# Patient Record
Sex: Female | Born: 2000 | Race: White | Hispanic: No | Marital: Single | Smoking: Never smoker
Health system: Southern US, Community
[De-identification: ages and names within clinical notes are randomized; demographics above are authoritative.]

## PROBLEM LIST (undated history)

## (undated) DIAGNOSIS — G43909 Migraine, unspecified, not intractable, without status migrainosus: Secondary | ICD-10-CM

## (undated) DIAGNOSIS — R7689 Other specified abnormal immunological findings in serum: Secondary | ICD-10-CM

## (undated) DIAGNOSIS — R768 Other specified abnormal immunological findings in serum: Secondary | ICD-10-CM

## (undated) DIAGNOSIS — D649 Anemia, unspecified: Secondary | ICD-10-CM

## (undated) HISTORY — PX: SEPTOPLASTY: SHX2393

## (undated) HISTORY — DX: Other specified abnormal immunological findings in serum: R76.8

## (undated) HISTORY — DX: Migraine, unspecified, not intractable, without status migrainosus: G43.909

## (undated) HISTORY — DX: Anemia, unspecified: D64.9

## (undated) HISTORY — DX: Other specified abnormal immunological findings in serum: R76.89

---
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
## 2021-03-01 ENCOUNTER — Other Ambulatory Visit: Payer: Self-pay

## 2021-03-01 DIAGNOSIS — R509 Fever, unspecified: Secondary | ICD-10-CM | POA: Diagnosis present

## 2021-03-01 DIAGNOSIS — J101 Influenza due to other identified influenza virus with other respiratory manifestations: Secondary | ICD-10-CM | POA: Insufficient documentation

## 2021-03-01 LAB — CBC
HCT: 33.6 % — ABNORMAL LOW (ref 36.0–46.0)
Hemoglobin: 11.2 g/dL — ABNORMAL LOW (ref 12.0–15.0)
MCH: 28 pg (ref 26.0–34.0)
MCHC: 33.3 g/dL (ref 30.0–36.0)
MCV: 84 fL (ref 80.0–100.0)
Platelets: 418 10*3/uL — ABNORMAL HIGH (ref 150–400)
RBC: 4 MIL/uL (ref 3.87–5.11)
RDW: 12.3 % (ref 11.5–15.5)
WBC: 9.9 10*3/uL (ref 4.0–10.5)
nRBC: 0 % (ref 0.0–0.2)

## 2021-03-01 LAB — COMPREHENSIVE METABOLIC PANEL
ALT: 13 U/L (ref 0–44)
AST: 18 U/L (ref 15–41)
Albumin: 3.2 g/dL — ABNORMAL LOW (ref 3.5–5.0)
Alkaline Phosphatase: 77 U/L (ref 38–126)
Anion gap: 8 (ref 5–15)
BUN: 12 mg/dL (ref 6–20)
CO2: 25 mmol/L (ref 22–32)
Calcium: 8.4 mg/dL — ABNORMAL LOW (ref 8.9–10.3)
Chloride: 104 mmol/L (ref 98–111)
Creatinine, Ser: 0.75 mg/dL (ref 0.44–1.00)
GFR, Estimated: >60 mL/min (ref >60)
Glucose, Bld: 109 mg/dL — ABNORMAL HIGH (ref 70–99)
Potassium: 3.1 mmol/L — ABNORMAL LOW (ref 3.5–5.1)
Sodium: 137 mmol/L (ref 135–145)
Total Bilirubin: 0.2 mg/dL — ABNORMAL LOW (ref 0.3–1.2)
Total Protein: 7.7 g/dL (ref 6.5–8.1)

## 2021-03-01 MED ORDER — ACETAMINOPHEN 325 MG PO TABS
650.0000 mg | ORAL_TABLET | Freq: Once | ORAL | Status: AC
  Administered 2021-03-01: 650 mg via ORAL
  Filled 2021-03-01: qty 2

## 2021-03-01 MED ORDER — ONDANSETRON 4 MG PO TBDP: 4.0000 mg | ORAL_TABLET | Freq: Once | ORAL | Status: DC

## 2021-03-01 NOTE — ED Triage Notes (Addendum)
Pt was diagnosed with the flu Tuesday and has been vomiting today. Pt states she feels dehydrated, states painful to swallow. Results available on epic.

## 2021-03-02 ENCOUNTER — Emergency Department
Admission: EM | Admit: 2021-03-02 | Discharge: 2021-03-02 | Disposition: A | Discharge status: Home / Self Care | Payer: 59 | Attending: Emergency Medicine | Admitting: Emergency Medicine

## 2021-03-02 ENCOUNTER — Encounter: Payer: Self-pay | Admitting: Emergency Medicine

## 2021-03-02 DIAGNOSIS — R112 Nausea with vomiting, unspecified: Secondary | ICD-10-CM

## 2021-03-02 DIAGNOSIS — J101 Influenza due to other identified influenza virus with other respiratory manifestations: Secondary | ICD-10-CM

## 2021-03-02 LAB — URINALYSIS, COMPLETE (UACMP) WITH MICROSCOPIC
Bacteria, UA: NONE SEEN
Bilirubin Urine: NEGATIVE
Glucose, UA: NEGATIVE mg/dL
Ketones, ur: 5 mg/dL — AB
Leukocytes,Ua: NEGATIVE
Nitrite: NEGATIVE
Protein, ur: 30 mg/dL — AB
Specific Gravity, Urine: 1.031 — ABNORMAL HIGH (ref 1.005–1.030)
WBC, UA: NONE SEEN WBC/hpf (ref 0–5)
pH: 5 (ref 5.0–8.0)

## 2021-03-02 LAB — POC URINE PREG, ED: Preg Test, Ur: NEGATIVE

## 2021-03-02 MED ORDER — ONDANSETRON 4 MG PO TBDP: 4.0000 mg | ORAL_TABLET | Freq: Four times a day (QID) | ORAL | 0 refills | Status: DC | PRN

## 2021-03-02 MED ORDER — DEXAMETHASONE SODIUM PHOSPHATE 10 MG/ML IJ SOLN
10.0000 mg | Freq: Once | INTRAMUSCULAR | Status: AC
  Administered 2021-03-02: 10 mg via INTRAVENOUS
  Filled 2021-03-02: qty 1

## 2021-03-02 MED ORDER — KETOROLAC TROMETHAMINE 30 MG/ML IJ SOLN
30.0000 mg | Freq: Once | INTRAMUSCULAR | Status: AC
  Administered 2021-03-02: 30 mg via INTRAVENOUS
  Filled 2021-03-02: qty 1

## 2021-03-02 MED ORDER — SODIUM CHLORIDE 0.9 % IV BOLUS (SEPSIS)
1000.0000 mL | Freq: Once | INTRAVENOUS | Status: AC
  Administered 2021-03-02: 1000 mL via INTRAVENOUS

## 2021-03-02 MED ORDER — ONDANSETRON HCL 4 MG/2ML IJ SOLN
4.0000 mg | Freq: Once | INTRAMUSCULAR | Status: AC
  Administered 2021-03-02: 4 mg via INTRAVENOUS
  Filled 2021-03-02: qty 2

## 2021-03-02 NOTE — Discharge Instructions (Addendum)
You may alternate Tylenol 1000 mg every 6 hours as needed for pain, fever and Ibuprofen 800 mg every 8 hours as needed for pain, fever.  Please take Ibuprofen with food.  Do not take more than 4000 mg of Tylenol (acetaminophen) in a 24 hour period.  I suspect that your nausea and vomiting is secondary to Tamiflu.  You may stop this medication.  Steps to find a Primary Care Provider (PCP):  Call [phone] or [phone] to access "[facility] Find a Doctor Service."  2.  You may also go on the [facility] website at [email or web address]

## 2021-03-02 NOTE — ED Notes (Signed)
Pt. Preg. Was NEGATIVE.

## 2021-03-02 NOTE — ED Provider Notes (Signed)
[facility] Emergency Department Provider Note  ____________________________________________   Event Date/Time   First MD Initiated Contact with Patient 03/02/21 0254     (approximate)  I have reviewed the triage vital signs and the nursing notes.   HISTORY  Chief Complaint Vomiting    HPI Jillian Wagner is a 20 y.o. female with no significant past medical history who presents to the emergency department with complaints of fevers, body aches, dry cough, sore throat and now nausea and vomiting.  States symptoms started on Monday, October 31.  [facility] day and was diagnosed with flu a.  Had a negative strep test and mono test.  States she is on Tamiflu.  No abdominal pain, diarrhea, dysuria.  Currently on her menstrual cycle.  She has been vaccinated against influenza this year.        History reviewed. No pertinent past medical history.  There are no problems to display for this patient.   History reviewed. No pertinent surgical history.  Prior to Admission medications   Medication Sig Start Date End Date Taking? Authorizing Provider  ondansetron (ZOFRAN ODT) 4 MG disintegrating tablet Take 1 tablet (4 mg total) by mouth every 6 (six) hours as needed for nausea or vomiting. 03/02/21  Yes Kiylee Thoreson, Layla Maw, DO    Allergies Patient has no allergy information on record.  History reviewed. No pertinent family history.  Social History    Review of Systems Constitutional: + fever. Eyes: No visual changes. ENT: + sore throat. Cardiovascular: Denies chest pain. Respiratory: Denies shortness of breath. Gastrointestinal: + nausea, vomiting.  No diarrhea. Genitourinary: Negative for dysuria. Musculoskeletal: Negative for back pain. Skin: Negative for rash. Neurological: Negative for focal weakness or numbness.  ____________________________________________   PHYSICAL EXAM:  VITAL SIGNS: ED Triage Vitals [03/01/21 2330]   Enc Vitals Group     BP (!) 143/93     Pulse Rate (!) 136     Resp 20     Temp (!) 101.4 F (38.6 C)     Temp Source Oral     SpO2 96 %     Weight 135 lb (61.2 kg)     Height 5\' 7"  (1.702 m)     Head Circumference      Peak Flow      Pain Score 8     Pain Loc      Pain Edu?      Excl. in GC?    CONSTITUTIONAL: Alert and oriented and responds appropriately to questions. Well-appearing; well-nourished HEAD: Normocephalic EYES: Conjunctivae clear, pupils appear equal, EOM appear intact ENT: normal nose; moist mucous membranes; patient has bilateral tonsillar hypertrophy with exudate, no uvular deviation or uvular swelling, no trismus or drooling, normal phonation NECK: Supple, normal ROM CARD: RRR; S1 and S2 appreciated; no murmurs, no clicks, no rubs, no gallops RESP: Normal chest excursion without splinting or tachypnea; breath sounds clear and equal bilaterally; no wheezes, no rhonchi, no rales, no hypoxia or respiratory distress, speaking full sentences ABD/GI: Normal bowel sounds; non-distended; soft, non-tender, no rebound, no guarding, no peritoneal signs, no hepatosplenomegaly BACK: The back appears normal EXT: Normal ROM in all joints; no deformity noted, no edema; no cyanosis SKIN: Normal color for age and race; warm; no rash on exposed skin NEURO: Moves all extremities equally PSYCH: The patient's mood and manner are appropriate.  ____________________________________________   LABS (all labs ordered are listed, but only abnormal results are displayed)  Labs  Reviewed  CBC - Abnormal; Notable for the following components:      Result Value   Hemoglobin 11.2 (*)    HCT 33.6 (*)    Platelets 418 (*)    All other components within normal limits  COMPREHENSIVE METABOLIC PANEL - Abnormal; Notable for the following components:   Potassium 3.1 (*)    Glucose, Bld 109 (*)    Calcium 8.4 (*)    Albumin 3.2 (*)    Total Bilirubin 0.2 (*)    All other components within  normal limits  URINALYSIS, COMPLETE (UACMP) WITH MICROSCOPIC - Abnormal; Notable for the following components:   Color, Urine YELLOW (*)    APPearance HAZY (*)    Specific Gravity, Urine 1.031 (*)    Hgb urine dipstick SMALL (*)    Ketones, ur 5 (*)    Protein, ur 30 (*)    All other components within normal limits  POC URINE PREG, ED   ____________________________________________  EKG   ____________________________________________  RADIOLOGY I, Karima Carrell, personally viewed and evaluated these images (plain radiographs) as part of my medical decision making, as well as reviewing the written report by the radiologist.  ED MD interpretation:    Official radiology report(s): No results found.  ____________________________________________   PROCEDURES  Procedure(s) performed (including Critical Care):  Procedures    ____________________________________________   INITIAL IMPRESSION / ASSESSMENT AND PLAN / ED COURSE  As part of my medical decision making, I reviewed the following data within the electronic MEDICAL RECORD NUMBER Nursing notes reviewed and incorporated, Labs reviewed , Old chart reviewed, and Notes from prior ED visits         Patient here with nausea and vomiting after recently being diagnosed with influenza A.  She is on Tamiflu which I suspect is likely the cause of the symptoms.  We discussed risk and benefits of this medication and she is going to stop taking it.  Will give IV fluids, Zofran here.  She does have tonsillar hypertrophy with exudate but states she was strep and mono negative.  Will give Toradol, Decadron for symptomatic relief.  No signs of deep space neck infection, PTA, uvulitis, Ludwig's on exam.  Otherwise well-appearing, nontoxic.  Labs, urine obtained from triage show no significant abnormality.  Will p.o. challenge after IV medications.  ED PROGRESS  Patient reports feeling much better.  Able to tolerate p.o.  I feel she is safe to  be discharged.  Will provide prescription of Zofran.   At this time, I do not feel there is any life-threatening condition present. I have reviewed, interpreted and discussed all results (EKG, imaging, lab, urine as appropriate) and exam findings with patient/family. I have reviewed nursing notes and appropriate previous records.  I feel the patient is safe to be discharged home without further emergent workup and can continue workup as an outpatient as needed. Discussed usual and customary return precautions. Patient/family verbalize understanding and are comfortable with this plan.  Outpatient follow-up has been provided as needed. All questions have been answered.  ____________________________________________   FINAL CLINICAL IMPRESSION(S) / ED DIAGNOSES  Final diagnoses:  Nausea and vomiting in adult  Influenza A     ED Discharge Orders          Ordered    ondansetron (ZOFRAN ODT) 4 MG disintegrating tablet  Every 6 hours PRN        03/02/21 0455            *Please note:  Jillian Sago  Wagner was evaluated in Emergency Department on 03/02/2021 for the symptoms described in the history of present illness. She was evaluated in the context of the global COVID-19 pandemic, which necessitated consideration that the patient might be at risk for infection with the SARS-CoV-2 virus that causes COVID-19. Institutional protocols and algorithms that pertain to the evaluation of patients at risk for COVID-19 are in a state of rapid change based on information released by regulatory bodies including the CDC and federal and state organizations. These policies and algorithms were followed during the patient's care in the ED.  Some ED evaluations and interventions may be delayed as a result of limited staffing during and the pandemic.*   Note:  This document was prepared using Dragon voice recognition software and may include unintentional dictation errors.    Leeman Johnsey, Layla Maw, DO 03/02/21 [phone]

## 2021-03-03 ENCOUNTER — Emergency Department: Payer: 59

## 2021-03-03 ENCOUNTER — Encounter: Payer: Self-pay | Admitting: Emergency Medicine

## 2021-03-03 ENCOUNTER — Emergency Department
Admission: EM | Admit: 2021-03-03 | Discharge: 2021-03-03 | Disposition: A | Discharge status: Home / Self Care | Payer: 59 | Attending: Emergency Medicine | Admitting: Emergency Medicine

## 2021-03-03 ENCOUNTER — Other Ambulatory Visit: Payer: Self-pay

## 2021-03-03 DIAGNOSIS — R0789 Other chest pain: Secondary | ICD-10-CM | POA: Diagnosis present

## 2021-03-03 LAB — D-DIMER, QUANTITATIVE: D-Dimer, Quant: 0.91 ug/mL-FEU — ABNORMAL HIGH (ref 0.00–0.50)

## 2021-03-03 LAB — TROPONIN I (HIGH SENSITIVITY): Troponin I (High Sensitivity): 4 ng/L (ref <18)

## 2021-03-03 MED ORDER — KETOROLAC TROMETHAMINE 60 MG/2ML IM SOLN: 15.0000 mg | Freq: Once | INTRAMUSCULAR | Status: DC

## 2021-03-03 MED ORDER — HYDROCODONE-ACETAMINOPHEN 5-325 MG PO TABS
1.0000 | ORAL_TABLET | Freq: Once | ORAL | Status: AC
  Administered 2021-03-03: 1 via ORAL
  Filled 2021-03-03: qty 1

## 2021-03-03 MED ORDER — IOHEXOL 350 MG/ML SOLN
75.0000 mL | Freq: Once | INTRAVENOUS | Status: AC | PRN
  Administered 2021-03-03: 75 mL via INTRAVENOUS
  Filled 2021-03-03: qty 75

## 2021-03-03 MED ORDER — HYDROCODONE-ACETAMINOPHEN 5-325 MG PO TABS: 1.0000 | ORAL_TABLET | Freq: Four times a day (QID) | ORAL | 0 refills | Status: AC | PRN

## 2021-03-03 MED ORDER — NAPROXEN 500 MG PO TABS: 500.0000 mg | ORAL_TABLET | Freq: Two times a day (BID) | ORAL | 0 refills | Status: DC

## 2021-03-03 MED ORDER — KETOROLAC TROMETHAMINE 30 MG/ML IJ SOLN
15.0000 mg | Freq: Once | INTRAMUSCULAR | Status: AC
  Administered 2021-03-03: 15 mg via INTRAVENOUS
  Filled 2021-03-03: qty 1

## 2021-03-03 NOTE — ED Triage Notes (Signed)
Pt via POV from home. Pt c/o non-radiating centralized stabbing. CP that started last night that gradually gotten worse. Pt also endorses SOB, states it hurts to take a deep breath. Pt did test Positive for the flu on Tuesday. Denies NVD. Denies fever.

## 2021-03-03 NOTE — ED Provider Notes (Signed)
[facility] Emergency Department Provider Note  ____________________________________________   Event Date/Time   First MD Initiated Contact with Patient 03/03/21 2004     (approximate)  I have reviewed the triage vital signs and the nursing notes.   HISTORY  Chief Complaint Chest Pain   HPI Jillian Wagner is a 20 y.o. female  presents to the emergency department for treatment and evaluation of midsternal chest pain that started last night and has gradually worsened.  She is also feeling somewhat short of breath.  She states it hurts to take a deep breath as well.  She tested positive for flu on Tuesday.  Most of her symptoms of gotten better and she has not had a fever for the past 24 hours.  No relief with over-the-counter cold medications..   History reviewed. No pertinent past medical history.  There are no problems to display for this patient.   History reviewed. No pertinent surgical history.  Prior to Admission medications   Medication Sig Start Date End Date Taking? Authorizing Provider  HYDROcodone-acetaminophen (NORCO/VICODIN) 5-325 MG tablet Take 1 tablet by mouth every 6 (six) hours as needed for up to 3 days for severe pain. 03/03/21 03/06/21 Yes Koya Hunger B, FNP  naproxen (NAPROSYN) 500 MG tablet Take 1 tablet (500 mg total) by mouth 2 (two) times daily with a meal. 03/03/21  Yes Marvine Encalade B, FNP  ondansetron (ZOFRAN ODT) 4 MG disintegrating tablet Take 1 tablet (4 mg total) by mouth every 6 (six) hours as needed for nausea or vomiting. 03/02/21   Ward, Layla MawKristen N, DO    Allergies Patient has no allergy information on record.  History reviewed. No pertinent family history.  Social History Social History   Tobacco Use   Smoking status: Never   Smokeless tobacco: Never    Review of Systems  Constitutional: No fever/chills. Eyes: No visual changes. ENT: No sore throat. Cardiovascular: Positive for chest chest pain.   Positive for pleuritic pain.  Negative for palpitations.  Negative for leg pain. Respiratory: Positive for shortness of breath. Gastrointestinal: Negative for abdominal pain.  No nausea, no vomiting.  No diarrhea.  No constipation. Genitourinary: Negative for dysuria. Musculoskeletal: Negative for back pain.  Skin: Negative for rash, lesion, wound. Neurological: Negative for headaches, focal weakness or numbness.  ____________________________________________   PHYSICAL EXAM:  VITAL SIGNS: ED Triage Vitals  Enc Vitals Group     BP 03/03/21 1806 133/86     Pulse Rate 03/03/21 1806 96     Resp 03/03/21 1806 20     Temp 03/03/21 1806 98.1 F (36.7 C)     Temp Source 03/03/21 1806 Oral     SpO2 03/03/21 1806 100 %     Weight 03/03/21 1807 135 lb (61.2 kg)     Height 03/03/21 1807 5\' 7"  (1.702 m)     Head Circumference --      Peak Flow --      Pain Score 03/03/21 1807 9     Pain Loc --      Pain Edu? --      Excl. in GC? --     Constitutional: Alert and oriented.  Overall well appearing and in no acute distress.  Normal mental status. Eyes: Conjunctivae are normal. PERRL. Head: Atraumatic. Nose: No congestion/rhinnorhea. Mouth/Throat: Mucous membranes are moist.  Oropharynx non-erythematous. Tongue normal in size and color. Neck: No stridor.  No carotid bruit appreciated on exam. Hematological/Lymphatic/Immunilogical: No cervical lymphadenopathy. Cardiovascular: Normal rate, regular  rhythm. Grossly normal heart sounds.  Good peripheral circulation. Respiratory: Normal respiratory effort.  No retractions. Lungs CTAB. Gastrointestinal: Soft and nontender. No distention. No abdominal bruits. No CVA tenderness. Genitourinary: Exam deferred. Musculoskeletal: No lower extremity tenderness.  No edema of extremities. Neurologic:  Normal speech and language. No gross focal neurologic deficits are appreciated. Skin:  Skin is warm, dry and intact. No rash noted. Psychiatric: Mood and  affect are normal. Speech and behavior are normal.  ____________________________________________   LABS (all labs ordered are listed, but only abnormal results are displayed)  Labs Reviewed  D-DIMER, QUANTITATIVE - Abnormal; Notable for the following components:      Result Value   D-Dimer, Quant 0.91 (*)    All other components within normal limits  TROPONIN I (HIGH SENSITIVITY)   ____________________________________________  EKG  ED ECG REPORT I, Tyhesha Dutson, FNP-BC personally viewed and interpreted this ECG.   Date: 03/03/2021   Rate: 94  Rhythm: normal EKG, normal sinus rhythm  Axis: Normal  Intervals:none  ST&T Change: No  ____________________________________________  RADIOLOGY  ED MD interpretation: Chest x-ray shows no acute cardiopulmonary abnormality.  CTA chest for PE negative for acute concerns per radiology.  I, Kem Boroughs, personally viewed and evaluated these images (plain radiographs) as part of my medical decision making, as well as reviewing the written report by the radiologist.  Official radiology report(s): No results found.  ____________________________________________   PROCEDURES  Procedure(s) performed: None  Procedures  Critical Care performed: No  ____________________________________________   INITIAL IMPRESSION / ASSESSMENT AND PLAN   20 year old female presenting to the emergency department for treatment and evaluation of midsternal chest pain with pleurisy and shortness of breath after testing positive for influenza 4 days ago.  See HPI for further details.   Differential diagnosis includes, but not limited to:  Differential includes, but is not limited to, viral syndrome, bronchitis including COPD exacerbation, reactive airway disease including asthma, CHF including exacerbation with or without pulmonary/interstitial edema, pneumothorax, ACS, thoracic trauma, and pulmonary embolism, ACS, aortic dissection, pulmonary  embolism, cardiac tamponade, pneumothorax, pneumonia, pericarditis, myocarditis, GI-related causes including esophagitis/gastritis, and musculoskeletal chest wall pain.    ED COURSE  Troponin is normal.  Patient is low risk for PE however D-dimer was elevated 2.91.  CTA of the chest to rule out PE was negative per the radiologist.  Patient will be treated with NSAIDs and short course of Norco.  If symptoms or not improving over the next few days, she is to follow-up with her primary care provider.  If symptoms change or worsen and she is unable to schedule appointment she is to return to the emergency department.     FINAL CLINICAL IMPRESSION(S) / ED DIAGNOSES  Final diagnoses:  Chest wall pain    ED Discharge Orders          Ordered    HYDROcodone-acetaminophen (NORCO/VICODIN) 5-325 MG tablet  Every 6 hours PRN        03/03/21 2237    naproxen (NAPROSYN) 500 MG tablet  2 times daily with meals        03/03/21 2237            As part of my medical decision making, I reviewed the following data within the electronic MEDICAL RECORD NUMBER EKG interpreted NSR    Menaal Russum was evaluated in Emergency Department on 03/04/2021 for the symptoms described in the history of present illness. She was evaluated in the context of the global COVID-19 pandemic, which necessitated  consideration that the patient might be at risk for infection with the SARS-CoV-2 virus that causes COVID-19. Institutional protocols and algorithms that pertain to the evaluation of patients at risk for COVID-19 are in a state of rapid change based on information released by regulatory bodies including the CDC and federal and state organizations. These policies and algorithms were followed during the patient's care in the ED.   Note:  This document was prepared using Dragon voice recognition software and may include unintentional dictation errors.    Victorino Dike, FNP 03/04/21 2157    Delman Kitten,  MD 03/04/21 2324

## 2021-03-03 NOTE — ED Provider Notes (Signed)
Emergency Medicine Provider Triage Evaluation Note  Jillian Wagner , a 20 y.o. female  was evaluated in triage.  Pt complains of Pt reports that she tested positive for the flu 4 days ago - Today she is c/o stabbing cheat pain that is worse with deep breaths, SHOB, generalized body aches - She is afebrile .  Review of Systems  Positive: Chest pain with inspiration, body aches, SHOB Negative: Fever, N/V/D, abd pain  Physical Exam  BP 133/86 (BP Location: Right Arm)   Pulse 96   Resp 20   Ht 5\' 7"  (1.702 m)   Wt 61.2 kg   LMP 02/13/2021   SpO2 100%   BMI 21.14 kg/m  Gen:   Awake, no distress   Resp:  Normal effort  MSK:   Moves extremities without difficulty generalized body aches  Other:    Medical Decision Making  Medically screening exam initiated at 6:08 PM.  Appropriate orders placed.  Jillian Wagner was informed that the remainder of the evaluation will be completed by another provider, this initial triage assessment does not replace that evaluation, and the importance of remaining in the ED until their evaluation is complete.  Pt appears in no distress, respirations are even and unlabored - EKG obtained d/t report of CP - I feel this is more respiratory in nature - Will obtain CXR    [name], NP 03/03/21 13/05/22, MD 03/03/21 1904

## 2021-03-03 NOTE — ED Notes (Signed)
Patient declined discharge vital signs. 

## 2021-05-17 ENCOUNTER — Encounter: Payer: Self-pay | Admitting: *Deleted

## 2021-05-17 ENCOUNTER — Other Ambulatory Visit: Payer: Self-pay | Admitting: *Deleted

## 2021-05-25 ENCOUNTER — Inpatient Hospital Stay: Payer: 59

## 2021-05-25 ENCOUNTER — Inpatient Hospital Stay: Payer: 59 | Admitting: Internal Medicine

## 2021-05-25 ENCOUNTER — Inpatient Hospital Stay: Payer: 59 | Attending: Internal Medicine | Admitting: Oncology

## 2021-05-25 ENCOUNTER — Other Ambulatory Visit: Payer: Self-pay

## 2021-05-25 ENCOUNTER — Encounter: Payer: Self-pay | Admitting: Oncology

## 2021-05-25 VITALS — BP 128/79 | HR 102 | Temp 98.7°F | Resp 20 | Wt 141.2 lb

## 2021-05-25 DIAGNOSIS — D508 Other iron deficiency anemias: Secondary | ICD-10-CM | POA: Diagnosis not present

## 2021-05-25 DIAGNOSIS — D509 Iron deficiency anemia, unspecified: Secondary | ICD-10-CM | POA: Diagnosis present

## 2021-05-25 DIAGNOSIS — E222 Syndrome of inappropriate secretion of antidiuretic hormone: Secondary | ICD-10-CM

## 2021-05-27 ENCOUNTER — Encounter: Payer: Self-pay | Admitting: Oncology

## 2021-05-27 DIAGNOSIS — D509 Iron deficiency anemia, unspecified: Secondary | ICD-10-CM | POA: Insufficient documentation

## 2021-05-27 NOTE — Progress Notes (Signed)
Hematology/Oncology Consult note [facility] Telephone:([phone] [phone]  Patient Care Team: Pcp, No as PCP - General Cammie Sickle, MD as Consulting Physician (Hematology and Oncology)   Name of the patient: Jillian Wagner  KF:[id number]  [birth year]    Reason for referral-iron deficiency anemia   Referring physician- Dr. Shelia Media  Date of visit: 05/27/21   History of presenting illness-patient is a 21 year old female referred for iron deficiency anemia.  Prior to this she was getting her care through pediatrics.  She reports that her menstrual cycles are not particularly heavy and the last for about 5 days.  She has had EGD and colonoscopy in the past as well.  She has a history of cyclical vomiting syndrome.  Denies any consistent use of NSAIDs.  Denies any blood loss in her stool or urine.  Denies any dark melanotic stools.  She is currently on oral iron and has taken it for many months.  Her most recent hemoglobin was 11.5 in December 2022 with a ferritin level of 8 and iron saturation of 9% with an elevated TIBC of 530.  Patient currently reports feeling fatigued but denies other complaints.  She has received IV iron in the past  ECOG PS- 0  Pain scale- 0   Review of systems- Review of Systems  Constitutional:  Positive for malaise/fatigue.   Allergies  Allergen Reactions   Garlic Nausea And Vomiting   Potassium Citrate Nausea And Vomiting    There are no problems to display for this patient.    Past Medical History:  Diagnosis Date   Anemia    Positive ANA (antinuclear antibody)      History reviewed. No pertinent surgical history.  Social History   Socioeconomic History   Marital status: Single    Spouse name: Not on file   Number of children: Not on file   Years of education: Not on file   Highest education level: Not on file  Occupational History   Not on file  Tobacco Use   Smoking status: Never     Passive exposure: Never   Smokeless tobacco: Never  Vaping Use   Vaping Use: Never used  Substance and Sexual Activity   Alcohol use: Not Currently   Drug use: Never   Sexual activity: Not Currently  Other Topics Concern   Not on file  Social History Narrative   Not on file   Social Determinants of Health   Financial Resource Strain: Not on file  Food Insecurity: Not on file  Transportation Needs: Not on file  Physical Activity: Not on file  Stress: Not on file  Social Connections: Not on file  Intimate Partner Violence: Not on file     Family History  Problem Relation Age of Onset   Raynaud syndrome Mother    Other Mother        epitaxis   Asthma Father    Other Sister        GERD   Psoriasis Maternal Grandmother    Celiac disease Maternal Grandmother    Ulcerative colitis Paternal Grandmother    Arthritis-Osteo Paternal Grandfather      Current Outpatient Medications:    albuterol (VENTOLIN HFA) 108 (90 Base) MCG/ACT inhaler, Inhale 2 puffs into the lungs every 6 (six) hours as needed., Disp: , Rfl:    amitriptyline (ELAVIL) 10 MG tablet, Take 2 tablets by mouth at bedtime., Disp: , Rfl:    levocetirizine (XYZAL) 5 MG tablet, Take 5 mg  by mouth every evening., Disp: , Rfl:    TRI-ESTARYLLA 0.18/0.215/0.25 MG-35 MCG tablet, Take 1 tablet by mouth daily., Disp: , Rfl:    vitamin C (ASCORBIC ACID) 500 MG tablet, Take 500 mg by mouth daily., Disp: , Rfl:    EPINEPHrine 0.3 mg/0.3 mL IJ SOAJ injection, Inject into the muscle. (Patient not taking: Reported on 05/25/2021), Disp: , Rfl:    naproxen (NAPROSYN) 500 MG tablet, Take 1 tablet (500 mg total) by mouth 2 (two) times daily with a meal. (Patient not taking: Reported on 05/25/2021), Disp: 30 tablet, Rfl: 0   ondansetron (ZOFRAN ODT) 4 MG disintegrating tablet, Take 1 tablet (4 mg total) by mouth every 6 (six) hours as needed for nausea or vomiting. (Patient not taking: Reported on 05/25/2021), Disp: 20 tablet, Rfl:  0   Physical exam:  Vitals:   05/25/21 1336  BP: 128/79  Pulse: (!) 102  Resp: 20  Temp: 98.7 F (37.1 C)  SpO2: 100%  Weight: 141 lb 3.2 oz (64 kg)   Physical Exam Cardiovascular:     Rate and Rhythm: Normal rate and regular rhythm.     Heart sounds: Normal heart sounds.  Pulmonary:     Effort: Pulmonary effort is normal.     Breath sounds: Normal breath sounds.  Abdominal:     General: Bowel sounds are normal.     Palpations: Abdomen is soft.  Skin:    General: Skin is warm and dry.  Neurological:     Mental Status: She is alert and oriented to person, place, and time.       CMP Latest Ref Rng & Units 03/01/2021  Glucose 70 - 99 mg/dL 109(H)  BUN 6 - 20 mg/dL 12  Creatinine 0.44 - 1.00 mg/dL 0.75  Sodium 135 - 145 mmol/L 137  Potassium 3.5 - 5.1 mmol/L 3.1(L)  Chloride 98 - 111 mmol/L 104  CO2 22 - 32 mmol/L 25  Calcium 8.9 - 10.3 mg/dL 8.4(L)  Total Protein 6.5 - 8.1 g/dL 7.7  Total Bilirubin 0.3 - 1.2 mg/dL 0.2(L)  Alkaline Phos 38 - 126 U/L 77  AST 15 - 41 U/L 18  ALT 0 - 44 U/L 13   CBC Latest Ref Rng & Units 03/01/2021  WBC 4.0 - 10.5 K/uL 9.9  Hemoglobin 12.0 - 15.0 g/dL 11.2(L)  Hematocrit 36.0 - 46.0 % 33.6(L)  Platelets 150 - 400 K/uL 418(H)     Assessment and plan- Patient is a 21 y.o. female referred for iron deficiency anemia  Patient noted to have mild anemia with a hemoglobin of 11.4 with labs suggestive of iron deficiency.  She has had EGD and colonoscopy in the past.  Menstrual cycles are not particularly heavy.  Unclear if iron deficiency is related to malabsorption.  Given her ongoing fatigue she would like to receive IV iron and I will recommend 5 doses of Venofer 200 mg given over the next 2 to 3 weeks.  We will repeat CBC ferritin and iron studies B12 folate TSH and celiac disease panel in 6 weeks and see her for a video visit 2 to 3 days later.  We will also check H. pylori stool antigen at this time   Thank you for this kind referral  and the opportunity to participate in the care of this patient   Visit Diagnosis Iron deficiency anemia  Dr. Randa Evens, MD, MPH [facility] [phone] 05/27/2021

## 2021-05-30 ENCOUNTER — Other Ambulatory Visit: Payer: Self-pay

## 2021-05-30 ENCOUNTER — Inpatient Hospital Stay: Payer: 59 | Attending: Oncology

## 2021-05-30 VITALS — BP 118/88 | HR 70 | Temp 97.8°F | Resp 18

## 2021-05-30 DIAGNOSIS — D509 Iron deficiency anemia, unspecified: Secondary | ICD-10-CM | POA: Insufficient documentation

## 2021-05-30 DIAGNOSIS — D508 Other iron deficiency anemias: Secondary | ICD-10-CM

## 2021-05-30 MED ORDER — SODIUM CHLORIDE 0.9 % IV SOLN
Freq: Once | INTRAVENOUS | Status: AC
  Filled 2021-05-30: qty 250

## 2021-05-30 MED ORDER — SODIUM CHLORIDE 0.9 % IV SOLN: 200.0000 mg | INTRAVENOUS | Status: DC

## 2021-05-30 MED ORDER — IRON SUCROSE 20 MG/ML IV SOLN
200.0000 mg | Freq: Once | INTRAVENOUS | Status: AC
  Administered 2021-05-30: 200 mg via INTRAVENOUS
  Filled 2021-05-30: qty 10

## 2021-05-31 ENCOUNTER — Telehealth: Payer: Self-pay | Admitting: *Deleted

## 2021-05-31 NOTE — Telephone Encounter (Signed)
Call returned to patient and informed of doctor response 

## 2021-05-31 NOTE — Telephone Encounter (Signed)
Patient called reporting that she had her Iron infusion yesterday and last night and today she has body aches and a headache. Asking if thus is normal or not. Please advise

## 2021-05-31 NOTE — Telephone Encounter (Signed)
Recomemnd taking prn tylenol and monitor. Nothing else to do for now

## 2021-06-01 ENCOUNTER — Other Ambulatory Visit: Payer: Self-pay

## 2021-06-01 ENCOUNTER — Inpatient Hospital Stay: Payer: 59

## 2021-06-01 VITALS — BP 120/68 | HR 75 | Temp 96.4°F | Resp 16

## 2021-06-01 DIAGNOSIS — D508 Other iron deficiency anemias: Secondary | ICD-10-CM

## 2021-06-01 DIAGNOSIS — D509 Iron deficiency anemia, unspecified: Secondary | ICD-10-CM | POA: Diagnosis not present

## 2021-06-01 MED ORDER — SODIUM CHLORIDE 0.9 % IV SOLN: 200.0000 mg | INTRAVENOUS | Status: DC

## 2021-06-01 MED ORDER — SODIUM CHLORIDE 0.9 % IV SOLN
Freq: Once | INTRAVENOUS | Status: AC
  Filled 2021-06-01: qty 250

## 2021-06-01 MED ORDER — IRON SUCROSE 20 MG/ML IV SOLN
200.0000 mg | Freq: Once | INTRAVENOUS | Status: AC
  Administered 2021-06-01: 200 mg via INTRAVENOUS
  Filled 2021-06-01: qty 10

## 2021-06-06 ENCOUNTER — Other Ambulatory Visit: Payer: Self-pay

## 2021-06-06 ENCOUNTER — Inpatient Hospital Stay: Payer: 59

## 2021-06-06 VITALS — BP 123/88 | HR 81 | Temp 97.8°F | Resp 17

## 2021-06-06 DIAGNOSIS — D508 Other iron deficiency anemias: Secondary | ICD-10-CM

## 2021-06-06 DIAGNOSIS — D509 Iron deficiency anemia, unspecified: Secondary | ICD-10-CM | POA: Diagnosis not present

## 2021-06-06 MED ORDER — SODIUM CHLORIDE 0.9 % IV SOLN
Freq: Once | INTRAVENOUS | Status: AC
  Filled 2021-06-06: qty 250

## 2021-06-06 MED ORDER — IRON SUCROSE 20 MG/ML IV SOLN
200.0000 mg | Freq: Once | INTRAVENOUS | Status: AC
  Administered 2021-06-06: 200 mg via INTRAVENOUS
  Filled 2021-06-06: qty 10

## 2021-06-06 MED ORDER — SODIUM CHLORIDE 0.9 % IV SOLN: 200.0000 mg | INTRAVENOUS | Status: DC

## 2021-06-08 ENCOUNTER — Inpatient Hospital Stay: Payer: 59

## 2021-06-08 ENCOUNTER — Other Ambulatory Visit: Payer: Self-pay

## 2021-06-08 VITALS — BP 116/80 | HR 64 | Temp 97.5°F | Resp 18

## 2021-06-08 DIAGNOSIS — D509 Iron deficiency anemia, unspecified: Secondary | ICD-10-CM | POA: Diagnosis not present

## 2021-06-08 DIAGNOSIS — D508 Other iron deficiency anemias: Secondary | ICD-10-CM

## 2021-06-08 MED ORDER — IRON SUCROSE 20 MG/ML IV SOLN
200.0000 mg | Freq: Once | INTRAVENOUS | Status: AC
  Administered 2021-06-08: 200 mg via INTRAVENOUS
  Filled 2021-06-08: qty 10

## 2021-06-08 MED ORDER — SODIUM CHLORIDE 0.9 % IV SOLN: 200.0000 mg | INTRAVENOUS | Status: DC

## 2021-06-08 MED ORDER — SODIUM CHLORIDE 0.9 % IV SOLN
Freq: Once | INTRAVENOUS | Status: AC
  Filled 2021-06-08: qty 250

## 2021-06-13 ENCOUNTER — Inpatient Hospital Stay: Payer: 59

## 2021-06-13 ENCOUNTER — Encounter: Payer: Self-pay | Admitting: Oncology

## 2021-06-13 ENCOUNTER — Other Ambulatory Visit: Payer: Self-pay

## 2021-06-13 ENCOUNTER — Telehealth: Payer: Self-pay | Admitting: *Deleted

## 2021-06-13 VITALS — BP 133/82 | HR 89 | Temp 97.5°F

## 2021-06-13 DIAGNOSIS — D509 Iron deficiency anemia, unspecified: Secondary | ICD-10-CM | POA: Diagnosis not present

## 2021-06-13 DIAGNOSIS — D508 Other iron deficiency anemias: Secondary | ICD-10-CM

## 2021-06-13 MED ORDER — IRON SUCROSE 20 MG/ML IV SOLN
200.0000 mg | Freq: Once | INTRAVENOUS | Status: AC
  Administered 2021-06-13: 200 mg via INTRAVENOUS
  Filled 2021-06-13: qty 10

## 2021-06-13 MED ORDER — SODIUM CHLORIDE 0.9 % IV SOLN: 200.0000 mg | INTRAVENOUS | Status: DC

## 2021-06-13 MED ORDER — SODIUM CHLORIDE 0.9 % IV SOLN
Freq: Once | INTRAVENOUS | Status: AC
  Filled 2021-06-13: qty 250

## 2021-06-13 NOTE — Telephone Encounter (Signed)
Patient has an infusion scheduled for 3 PM this afternoon. This morning she was diagnosed with a sinus infection (tested negative for Covid). She wants to know if it is ok for her to come to her infusion appointment. Please call her with response.

## 2021-06-13 NOTE — Telephone Encounter (Signed)
MyChart message sent to patient:  Jillian Wagner, since you tested negative for COVID it is OK to keep your iron appointment for this afternoon.  Let us know if you would prefer to cancel or reschedule.

## 2021-06-13 NOTE — Patient Instructions (Addendum)
[facility]  Discharge Instructions: ?Thank you for choosing [facility] to provide your oncology and hematology care.  ?If you have a lab appointment with the Cancer Center, please go directly to the Cancer Center and check in at the registration area. ? ?Wear comfortable clothing and clothing appropriate for easy access to any Portacath or PICC line.  ? ?We strive to give you quality time with your provider. You may need to reschedule your appointment if you arrive late (15 or more minutes).  Arriving late affects you and other patients whose appointments are after yours.  Also, if you miss three or more appointments without notifying the office, you may be dismissed from the clinic at the provider?s discretion.    ?  ?For prescription refill requests, have your pharmacy contact our office and allow 72 hours for refills to be completed.   ? ?Today you received the following chemotherapy and/or immunotherapy agents VENOFER    ?  ?To help prevent nausea and vomiting after your treatment, we encourage you to take your nausea medication as directed. ? ?BELOW ARE SYMPTOMS THAT SHOULD BE REPORTED IMMEDIATELY: ?*FEVER GREATER THAN 100.4 F (38 ?C) OR HIGHER ?*CHILLS OR SWEATING ?*NAUSEA AND VOMITING THAT IS NOT CONTROLLED WITH YOUR NAUSEA MEDICATION ?*UNUSUAL SHORTNESS OF BREATH ?*UNUSUAL BRUISING OR BLEEDING ?*URINARY PROBLEMS (pain or burning when urinating, or frequent urination) ?*BOWEL PROBLEMS (unusual diarrhea, constipation, pain near the anus) ?TENDERNESS IN MOUTH AND THROAT WITH OR WITHOUT PRESENCE OF ULCERS (sore throat, sores in mouth, or a toothache) ?UNUSUAL RASH, SWELLING OR PAIN  ?UNUSUAL VAGINAL DISCHARGE OR ITCHING  ? ?Items with * indicate a potential emergency and should be followed up as soon as possible or go to the Emergency Department if any problems should occur. ? ?Please show the CHEMOTHERAPY ALERT CARD or IMMUNOTHERAPY ALERT CARD at check-in to the  Emergency Department and triage nurse. ? ?Should you have questions after your visit or need to cancel or reschedule your appointment, please contact [facility]  [phone] and follow the prompts.  Office hours are 8:00 a.m. to 4:30 p.m. Monday - Friday. Please note that voicemails left after 4:00 p.m. may not be returned until the following business day.  We are closed weekends and major holidays. You have access to a nurse at all times for urgent questions. Please call the main number to the clinic [phone] and follow the prompts. ? ?For any non-urgent questions, you may also contact your provider using MyChart. We now offer e-Visits for anyone 18 and older to request care online for non-urgent symptoms. For details visit [email or web address]. ?  ?Also download the MyChart app! Go to the app store, search "MyChart", open the app, select [facility], and log in with your MyChart username and password. ? ?Due to Covid, a mask is required upon entering the hospital/clinic. If you do not have a mask, one will be given to you upon arrival. For doctor visits, patients may have 1 support person aged 18 or older with them. For treatment visits, patients cannot have anyone with them due to current Covid guidelines and our immunocompromised population.  ? ?Iron Sucrose Injection ?What is this medication? ?IRON SUCROSE (EYE ern SOO krose) treats low levels of iron (iron deficiency anemia) in people with kidney disease. Iron is a mineral that plays an important role in making red blood cells, which carry oxygen from your lungs to the rest of your body. ?This medicine may   be used for other purposes; ask your health care provider or pharmacist if you have questions. ?COMMON BRAND NAME(S): Venofer ?What should I tell my care team before I take this medication? ?They need to know if you have any of these conditions: ?Anemia not caused by low iron levels ?Heart disease ?High levels of  iron in the blood ?Kidney disease ?Liver disease ?An unusual or allergic reaction to iron, other medications, foods, dyes, or preservatives ?Pregnant or trying to get pregnant ?Breast-feeding ?How should I use this medication? ?This medication is for infusion into a vein. It is given in a hospital or clinic setting. ?Talk to your care team about the use of this medication in children. While this medication may be prescribed for children as young as 2 years for selected conditions, precautions do apply. ?Overdosage: If you think you have taken too much of this medicine contact a poison control center or emergency room at once. ?NOTE: This medicine is only for you. Do not share this medicine with others. ?What if I miss a dose? ?It is important not to miss your dose. Call your care team if you are unable to keep an appointment. ?What may interact with this medication? ?Do not take this medication with any of the following: ?Deferoxamine ?Dimercaprol ?Other iron products ?This medication may also interact with the following: ?Chloramphenicol ?Deferasirox ?This list may not describe all possible interactions. Give your health care provider a list of all the medicines, herbs, non-prescription drugs, or dietary supplements you use. Also tell them if you smoke, drink alcohol, or use illegal drugs. Some items may interact with your medicine. ?What should I watch for while using this medication? ?Visit your care team regularly. Tell your care team if your symptoms do not start to get better or if they get worse. You may need blood work done while you are taking this medication. ?You may need to follow a special diet. Talk to your care team. Foods that contain iron include: whole grains/cereals, dried fruits, beans, or peas, leafy green vegetables, and organ meats (liver, kidney). ?What side effects may I notice from receiving this medication? ?Side effects that you should report to your care team as soon as  possible: ?Allergic reactions--skin rash, itching, hives, swelling of the face, lips, tongue, or throat ?Low blood pressure--dizziness, feeling faint or lightheaded, blurry vision ?Shortness of breath ?Side effects that usually do not require medical attention (report to your care team if they continue or are bothersome): ?Flushing ?Headache ?Joint pain ?Muscle pain ?Nausea ?Pain, redness, or irritation at injection site ?This list may not describe all possible side effects. Call your doctor for medical advice about side effects. You may report side effects to FDA at 1-800-FDA-1088. ?Where should I keep my medication? ?This medication is given in a hospital or clinic and will not be stored at home. ?NOTE: This sheet is a summary. It may not cover all possible information. If you have questions about this medicine, talk to your doctor, pharmacist, or health care provider. ?? 2022 Elsevier/Gold Standard (2020-09-08 00:00:00) ? ?

## 2021-07-04 ENCOUNTER — Inpatient Hospital Stay: Payer: 59 | Attending: Oncology

## 2021-07-04 ENCOUNTER — Other Ambulatory Visit: Payer: Self-pay

## 2021-07-04 ENCOUNTER — Encounter: Payer: Self-pay | Admitting: Oncology

## 2021-07-04 ENCOUNTER — Other Ambulatory Visit: Payer: Self-pay | Admitting: *Deleted

## 2021-07-04 DIAGNOSIS — E222 Syndrome of inappropriate secretion of antidiuretic hormone: Secondary | ICD-10-CM

## 2021-07-04 DIAGNOSIS — D508 Other iron deficiency anemias: Secondary | ICD-10-CM

## 2021-07-04 DIAGNOSIS — D509 Iron deficiency anemia, unspecified: Secondary | ICD-10-CM | POA: Insufficient documentation

## 2021-07-04 DIAGNOSIS — E538 Deficiency of other specified B group vitamins: Secondary | ICD-10-CM | POA: Insufficient documentation

## 2021-07-04 LAB — CBC WITH DIFFERENTIAL/PLATELET
Abs Immature Granulocytes: 0.01 10*3/uL (ref 0.00–0.07)
Basophils Absolute: 0 10*3/uL (ref 0.0–0.1)
Basophils Relative: 0 %
Eosinophils Absolute: 0.3 10*3/uL (ref 0.0–0.5)
Eosinophils Relative: 3 %
HCT: 38.8 % (ref 36.0–46.0)
Hemoglobin: 13.2 g/dL (ref 12.0–15.0)
Immature Granulocytes: 0 %
Lymphocytes Relative: 29 %
Lymphs Abs: 2.4 10*3/uL (ref 0.7–4.0)
MCH: 29.2 pg (ref 26.0–34.0)
MCHC: 34 g/dL (ref 30.0–36.0)
MCV: 85.8 fL (ref 80.0–100.0)
Monocytes Absolute: 0.6 10*3/uL (ref 0.1–1.0)
Monocytes Relative: 7 %
Neutro Abs: 5 10*3/uL (ref 1.7–7.7)
Neutrophils Relative %: 61 %
Platelets: 362 10*3/uL (ref 150–400)
RBC: 4.52 MIL/uL (ref 3.87–5.11)
RDW: 14.7 % (ref 11.5–15.5)
WBC: 8.3 10*3/uL (ref 4.0–10.5)
nRBC: 0 % (ref 0.0–0.2)

## 2021-07-04 LAB — IRON AND TIBC
Iron: 70 ug/dL (ref 28–170)
Saturation Ratios: 15 % (ref 10.4–31.8)
TIBC: 454 ug/dL — ABNORMAL HIGH (ref 250–450)
UIBC: 384 ug/dL

## 2021-07-04 LAB — VITAMIN B12: Vitamin B-12: 210 pg/mL (ref 180–914)

## 2021-07-04 LAB — FOLATE: Folate: 25 ng/mL (ref >5.9)

## 2021-07-04 LAB — FERRITIN: Ferritin: 210 ng/mL (ref 11–307)

## 2021-07-04 LAB — TSH: TSH: 0.477 u[IU]/mL (ref 0.350–4.500)

## 2021-07-04 NOTE — Telephone Encounter (Signed)
Called pt and informed that stool can be brought in room temp, does not need to be refrigerated or frozen. Pt understands.  ?

## 2021-07-06 ENCOUNTER — Encounter: Payer: Self-pay | Admitting: Oncology

## 2021-07-06 ENCOUNTER — Inpatient Hospital Stay (HOSPITAL_BASED_OUTPATIENT_CLINIC_OR_DEPARTMENT_OTHER): Payer: 59 | Admitting: Oncology

## 2021-07-06 DIAGNOSIS — E538 Deficiency of other specified B group vitamins: Secondary | ICD-10-CM | POA: Diagnosis not present

## 2021-07-06 DIAGNOSIS — D508 Other iron deficiency anemias: Secondary | ICD-10-CM | POA: Diagnosis not present

## 2021-07-06 LAB — H. PYLORI ANTIGEN, STOOL: H. Pylori Stool Ag, Eia: NEGATIVE

## 2021-07-06 LAB — CELIAC DISEASE PANEL
Endomysial Ab, IgA: NEGATIVE
IgA: 382 mg/dL — ABNORMAL HIGH (ref 87–352)
Tissue Transglutaminase Ab, IgA: <2 U/mL (ref 0–3)

## 2021-07-06 NOTE — Progress Notes (Signed)
I connected with Jillian Wagner on 07/06/21 at  1:00 PM EST by video enabled telemedicine visit and verified that I am speaking with the correct person using two identifiers. ?  ?I discussed the limitations, risks, security and privacy concerns of performing an evaluation and management service by telemedicine and the availability of in-person appointments. I also discussed with the patient that there may be a patient responsible charge related to this service. The patient expressed understanding and agreed to proceed. ? ?Other persons participating in the visit and their role in the encounter:  nonehi ? ?Patient's location:  home ?Provider's location:  work ? ?Chief Complaint:   Routine follow-up of iron deficiency anemia ? ?History of present illness: patient is a 22 year old female referred for iron deficiency anemia.  Prior to this she was getting her care through pediatrics.  She reports that her menstrual cycles are not particularly heavy and the last for about 5 days.  She has had EGD and colonoscopy in the past as well.  She has a history of cyclical vomiting syndrome.  Denies any consistent use of NSAIDs.  Denies any blood loss in her stool or urine.  Denies any dark melanotic stools.  She is currently on oral iron and has taken it for many months.  Her most recent hemoglobin was 11.5 in December 2022 with a ferritin level of 8 and iron saturation of 9% with an elevated TIBC of 530.   ? ? ?Interval history reports improvement in her energy levels after receiving IV iron. ? ? ?Review of Systems  ?Constitutional:  Negative for chills, fever, malaise/fatigue and weight loss.  ?HENT:  Negative for congestion, ear discharge and nosebleeds.   ?Eyes:  Negative for blurred vision.  ?Respiratory:  Negative for cough, hemoptysis, sputum production, shortness of breath and wheezing.   ?Cardiovascular:  Negative for chest pain, palpitations, orthopnea and claudication.  ?Gastrointestinal:  Negative for abdominal  pain, blood in stool, constipation, diarrhea, heartburn, melena, nausea and vomiting.  ?Genitourinary:  Negative for dysuria, flank pain, frequency, hematuria and urgency.  ?Musculoskeletal:  Negative for back pain, joint pain and myalgias.  ?Skin:  Negative for rash.  ?Neurological:  Negative for dizziness, tingling, focal weakness, seizures, weakness and headaches.  ?Endo/Heme/Allergies:  Does not bruise/bleed easily.  ?Psychiatric/Behavioral:  Negative for depression and suicidal ideas. The patient does not have insomnia.   ? ?Allergies  ?Allergen Reactions  ? Garlic Nausea And Vomiting  ? Potassium Citrate Nausea And Vomiting  ? ? ?Past Medical History:  ?Diagnosis Date  ? Anemia   ? Positive ANA (antinuclear antibody)   ? ? ?History reviewed. No pertinent surgical history. ? ?Social History  ? ?Socioeconomic History  ? Marital status: Single  ?  Spouse name: Not on file  ? Number of children: Not on file  ? Years of education: Not on file  ? Highest education level: Not on file  ?Occupational History  ? Not on file  ?Tobacco Use  ? Smoking status: Never  ?  Passive exposure: Never  ? Smokeless tobacco: Never  ?Vaping Use  ? Vaping Use: Never used  ?Substance and Sexual Activity  ? Alcohol use: Not Currently  ? Drug use: Never  ? Sexual activity: Not Currently  ?Other Topics Concern  ? Not on file  ?Social History Narrative  ? Not on file  ? ?Social Determinants of Health  ? ?Financial Resource Strain: Not on file  ?Food Insecurity: Not on file  ?Transportation Needs: Not on file  ?  Physical Activity: Not on file  ?Stress: Not on file  ?Social Connections: Not on file  ?Intimate Partner Violence: Not on file  ? ? ?Family History  ?Problem Relation Age of Onset  ? Raynaud syndrome Mother   ? Other Mother   ?     epitaxis  ? Asthma Father   ? Other Sister   ?     GERD  ? Psoriasis Maternal Grandmother   ? Celiac disease Maternal Grandmother   ? Ulcerative colitis Paternal Grandmother   ? Arthritis-Osteo Paternal  Grandfather   ? ? ? ?Current Outpatient Medications:  ?  amitriptyline (ELAVIL) 10 MG tablet, Take 2 tablets by mouth at bedtime., Disp: , Rfl:  ?  levocetirizine (XYZAL) 5 MG tablet, Take 5 mg by mouth every evening., Disp: , Rfl:  ?  TRI-ESTARYLLA 0.18/0.215/0.25 MG-35 MCG tablet, Take 1 tablet by mouth daily., Disp: , Rfl:  ?  vitamin C (ASCORBIC ACID) 500 MG tablet, Take 500 mg by mouth daily., Disp: , Rfl:  ?  albuterol (VENTOLIN HFA) 108 (90 Base) MCG/ACT inhaler, Inhale 2 puffs into the lungs every 6 (six) hours as needed. (Patient not taking: Reported on 07/06/2021), Disp: , Rfl:  ?  EPINEPHrine 0.3 mg/0.3 mL IJ SOAJ injection, Inject into the muscle. (Patient not taking: Reported on 05/25/2021), Disp: , Rfl:  ? ?No results found. ? ?No images are attached to the encounter. ? ? ?CMP Latest Ref Rng & Units 03/01/2021  ?Glucose 70 - 99 mg/dL 109(H)  ?BUN 6 - 20 mg/dL 12  ?Creatinine 0.44 - 1.00 mg/dL 0.75  ?Sodium 135 - 145 mmol/L 137  ?Potassium 3.5 - 5.1 mmol/L 3.1(L)  ?Chloride 98 - 111 mmol/L 104  ?CO2 22 - 32 mmol/L 25  ?Calcium 8.9 - 10.3 mg/dL 8.4(L)  ?Total Protein 6.5 - 8.1 g/dL 7.7  ?Total Bilirubin 0.3 - 1.2 mg/dL 0.2(L)  ?Alkaline Phos 38 - 126 U/L 77  ?AST 15 - 41 U/L 18  ?ALT 0 - 44 U/L 13  ? ?CBC Latest Ref Rng & Units 07/04/2021  ?WBC 4.0 - 10.5 K/uL 8.3  ?Hemoglobin 12.0 - 15.0 g/dL 13.2  ?Hematocrit 36.0 - 46.0 % 38.8  ?Platelets 150 - 400 K/uL 362  ? ? ? ?Observation/objective: Appears in no acute distress over video visit today.  Breathing is nonlabored ? ?Assessment and plan: Patient is a 21 year old female with history of iron deficiency anemia s/p IV iron this is a routine follow-up visit ? ?After receiving 5 doses of Venofer patient's hemoglobin has normalized to 13.2 from a pre-existing value of 11.2.Ferritin levels are normal at 210.  Folate, TSH normal.  Celiac disease panel negative.  H. pylori stool antigen negative.  B12 levels are low at 210 and have asked her to take oral B12 1000  mcg p.o. daily.  We will check CBC ferritin and iron studies and B12 levels in 6 months and I will see her thereafter ? ?Follow-up instructions: As above ? ?I discussed the assessment and treatment plan with the patient. The patient was provided an opportunity to ask questions and all were answered. The patient agreed with the plan and demonstrated an understanding of the instructions. ?  ?The patient was advised to call back or seek an in-person evaluation if the symptoms worsen or if the condition fails to improve as anticipated. ? ? ?Visit Diagnosis: ?1. Other iron deficiency anemia   ?2. B12 deficiency   ? ? ?Dr. Randa Evens, MD, MPH ?[facility]  [facility] ?Tel- [phone] ?07/06/2021 ?12:48 PM ? ?

## 2021-07-08 ENCOUNTER — Encounter: Payer: Self-pay | Admitting: Oncology

## 2021-12-10 ENCOUNTER — Other Ambulatory Visit: Payer: 59

## 2021-12-17 ENCOUNTER — Inpatient Hospital Stay: Payer: 59 | Attending: Oncology

## 2021-12-17 DIAGNOSIS — E538 Deficiency of other specified B group vitamins: Secondary | ICD-10-CM | POA: Insufficient documentation

## 2021-12-17 DIAGNOSIS — D508 Other iron deficiency anemias: Secondary | ICD-10-CM | POA: Diagnosis present

## 2021-12-17 LAB — CBC WITH DIFFERENTIAL/PLATELET
Abs Immature Granulocytes: 0.07 10*3/uL (ref 0.00–0.07)
Basophils Absolute: 0 10*3/uL (ref 0.0–0.1)
Basophils Relative: 0 %
Eosinophils Absolute: 0.2 10*3/uL (ref 0.0–0.5)
Eosinophils Relative: 2 %
HCT: 37.5 % (ref 36.0–46.0)
Hemoglobin: 12.8 g/dL (ref 12.0–15.0)
Immature Granulocytes: 1 %
Lymphocytes Relative: 14 %
Lymphs Abs: 2.1 10*3/uL (ref 0.7–4.0)
MCH: 30.7 pg (ref 26.0–34.0)
MCHC: 34.1 g/dL (ref 30.0–36.0)
MCV: 89.9 fL (ref 80.0–100.0)
Monocytes Absolute: 0.9 10*3/uL (ref 0.1–1.0)
Monocytes Relative: 6 %
Neutro Abs: 11.6 10*3/uL — ABNORMAL HIGH (ref 1.7–7.7)
Neutrophils Relative %: 77 %
Platelets: 352 10*3/uL (ref 150–400)
RBC: 4.17 MIL/uL (ref 3.87–5.11)
RDW: 12 % (ref 11.5–15.5)
WBC: 14.9 10*3/uL — ABNORMAL HIGH (ref 4.0–10.5)
nRBC: 0 % (ref 0.0–0.2)

## 2021-12-17 LAB — FERRITIN: Ferritin: 84 ng/mL (ref 11–307)

## 2021-12-17 LAB — VITAMIN B12: Vitamin B-12: 212 pg/mL (ref 180–914)

## 2021-12-17 LAB — IRON AND TIBC
Iron: 132 ug/dL (ref 28–170)
Saturation Ratios: 29 % (ref 10.4–31.8)
TIBC: 456 ug/dL — ABNORMAL HIGH (ref 250–450)
UIBC: 324 ug/dL

## 2021-12-18 ENCOUNTER — Telehealth: Payer: 59 | Admitting: Oncology

## 2021-12-19 ENCOUNTER — Inpatient Hospital Stay (HOSPITAL_BASED_OUTPATIENT_CLINIC_OR_DEPARTMENT_OTHER): Payer: 59 | Admitting: Medical Oncology

## 2021-12-19 ENCOUNTER — Encounter: Payer: Self-pay | Admitting: Medical Oncology

## 2021-12-19 DIAGNOSIS — E538 Deficiency of other specified B group vitamins: Secondary | ICD-10-CM | POA: Diagnosis not present

## 2021-12-19 DIAGNOSIS — D508 Other iron deficiency anemias: Secondary | ICD-10-CM

## 2021-12-19 NOTE — Progress Notes (Signed)
Virtual Visit Progress Note  Ms. Tietje,you are scheduled for a virtual visit with your provider today.    Just as we do with appointments in the office, we must obtain your consent to participate.  Your consent will be active for this visit and any virtual visit you may have with one of our providers in the next 365 days.    If you have a MyChart account, I can also send a copy of this consent to you electronically.  All virtual visits are billed to your insurance company just like a traditional visit in the office.  As this is a virtual visit, video technology does not allow for your provider to perform a traditional examination.  This may limit your provider's ability to fully assess your condition.  If your provider identifies any concerns that need to be evaluated in person or the need to arrange testing such as labs, EKG, etc, we will make arrangements to do so.    Although advances in technology are sophisticated, we cannot ensure that it will always work on either your end or our end.  If the connection with a video visit is poor, we may have to switch to a telephone visit.  With either a video or telephone visit, we are not always able to ensure that we have a secure connection.   I need to obtain your verbal consent now.   Are you willing to proceed with your visit today?   Abril Needler has provided verbal consent on 12/19/2021 for a virtual visit (video or telephone).   SHANOAH BUENDIA, PA-C 12/19/2021  2:04 PM    I connected with Jillian Wagner on 12/19/21 at  2:00 PM EDT by video enabled telemedicine visit and verified that I am speaking with the correct person using two identifiers.   I discussed the limitations, risks, security and privacy concerns of performing an evaluation and management service by telemedicine and the availability of in-person appointments. I also discussed with the patient that there may be a patient responsible charge related to this service. The  patient expressed understanding and agreed to proceed.   Other persons participating in the visit and their role in the encounter: None  Patient's location: [facility]  Provider's location: Clinic   Chief Complaint: IDA  History of present illness: She reports that her menstrual cycles are not particularly heavy and the last for about 5 days. She has had EGD and colonoscopy in the past as well.  She has a history of cyclical vomiting syndrome.  Denies any consistent use of NSAIDs.  Denies any blood loss in her stool or urine. Denies any dark melanotic stools.  She is currently on oral iron and has taken it for many months.  Her most recent hemoglobin was 11.5 in December 2022 with a ferritin level of 8 and iron saturation of 9% with an elevated TIBC of 530.  Patient currently reports feeling fatigued but denies other complaints.  She has received IV iron in the past.   Interval history: Today she reports that she is doing well.  At her last follow-up B12 supplementation was recommended.  Patient states that she has been taking this sporadically due to her crazy schedule during the summer but has plans to return to taking this daily.  She tolerated this well.  Has had IV iron well in the past.  She again reports that oral iron supplementation was not beneficial to her in the past as it did not raise  her iron or ferritin levels.  Denies any excessive fatigue, shortness of breath or bleeding.     Patient Care Team: Pcp, No as PCP - General Earna Coder, MD as Consulting Physician (Hematology and Oncology)   Name of the patient: Jillian Wagner  [id number]  [birth year]   Date of visit: 12/19/21  History of Presenting Illness-   Review of systems- ROS   Allergies  Allergen Reactions   Garlic Nausea And Vomiting   Potassium Citrate Nausea And Vomiting    Past Medical History:  Diagnosis Date   Anemia    Positive ANA (antinuclear antibody)     History reviewed. No  pertinent surgical history.  Social History   Socioeconomic History   Marital status: Single    Spouse name: Not on file   Number of children: Not on file   Years of education: Not on file   Highest education level: Not on file  Occupational History   Not on file  Tobacco Use   Smoking status: Never    Passive exposure: Never   Smokeless tobacco: Never  Vaping Use   Vaping Use: Never used  Substance and Sexual Activity   Alcohol use: Not Currently   Drug use: Never   Sexual activity: Not Currently  Other Topics Concern   Not on file  Social History Narrative   Not on file   Social Determinants of Health   Financial Resource Strain: Not on file  Food Insecurity: Not on file  Transportation Needs: Not on file  Physical Activity: Not on file  Stress: Not on file  Social Connections: Not on file  Intimate Partner Violence: Not on file    Immunization History  Administered Date(s) Administered   Influenza,inj,Quad PF,6+ Mos 02/06/2016, 01/28/2021   PFIZER(Purple Top)SARS-COV-2 Vaccination 06/25/2019, 07/16/2019, 03/31/2020   Pfizer Covid-19 Vaccine Bivalent Booster 76yrs & up 01/28/2021    Family History  Problem Relation Age of Onset   Raynaud syndrome Mother    Other Mother        epitaxis   Asthma Father    Other Sister        GERD   Psoriasis Maternal Grandmother    Celiac disease Maternal Grandmother    Ulcerative colitis Paternal Grandmother    Arthritis-Osteo Paternal Grandfather      Current Outpatient Medications:    amitriptyline (ELAVIL) 10 MG tablet, Take 2 tablets by mouth at bedtime., Disp: , Rfl:    buPROPion (WELLBUTRIN XL) 300 MG 24 hr tablet, , Disp: , Rfl:    levocetirizine (XYZAL) 5 MG tablet, Take 5 mg by mouth every evening., Disp: , Rfl:    Norgestimate-Ethinyl Estradiol Triphasic (TRI-LO-MARZIA) 0.18/0.215/0.25 MG-25 MCG tab, 1 tablet, Disp: , Rfl:    TRI-ESTARYLLA 0.18/0.215/0.25 MG-35 MCG tablet, Take 1 tablet by mouth daily.,  Disp: , Rfl:    vitamin C (ASCORBIC ACID) 500 MG tablet, Take 500 mg by mouth daily., Disp: , Rfl:    albuterol (VENTOLIN HFA) 108 (90 Base) MCG/ACT inhaler, Inhale 2 puffs into the lungs every 6 (six) hours as needed. (Patient not taking: Reported on 07/06/2021), Disp: , Rfl:    EPINEPHrine 0.3 mg/0.3 mL IJ SOAJ injection, Inject into the muscle. (Patient not taking: Reported on 05/25/2021), Disp: , Rfl:   Physical exam: Exam limited due to telemedicine Physical Exam  Skin: No significant pallor  Assessment and plan- Patient is a 21 y.o. female here for anemia follow-up    Visit Diagnosis 1. Other iron deficiency anemia  2. B12 deficiency     IDA: Chronic and stable today.  We reviewed her recent laboratory results.  I recommended that she maintain an iron rich diet especially iron rich foods first thing in the morning.  I would also recommend that she continue her B12 supplementation.  We discussed that oral B12 supplementation via pill form is often not as beneficial as droplet form.  She could trial this.  Goal would be for her to have daily supplementation before her next follow-up so we can see if she requires B12 injections instead which we discussed.  At this time no IV iron is needed.  She will return in 5 months prior to her study abroad in [place] with labs.   Patient expressed understanding and was in agreement with this plan. She also understands that She can call clinic at any time with any questions, concerns, or complaints.   I discussed the assessment and treatment plan with the patient. The patient was provided an opportunity to ask questions and all were answered. The patient agreed with the plan and demonstrated an understanding of the instructions.   The patient was advised to call back or seek an in-person evaluation if the symptoms worsen or if the condition fails to improve as anticipated.   I spent 10 minutes face-to-face video visit time dedicated to the care of  this patient on the date of this encounter to include pre-visit review of recent and most recent past lab values, face-to-face time with the patient, and post visit ordering of testing/documentation.   Thank you for allowing me to participate in the care of this very pleasant patient.   Clent Jacks PA-C [facility]  CC:

## 2022-03-03 ENCOUNTER — Other Ambulatory Visit: Payer: Self-pay

## 2022-03-03 ENCOUNTER — Emergency Department
Admission: EM | Admit: 2022-03-03 | Discharge: 2022-03-03 | Disposition: A | Discharge status: Home / Self Care | Payer: 59 | Attending: Emergency Medicine | Admitting: Emergency Medicine

## 2022-03-03 DIAGNOSIS — W228XXA Striking against or struck by other objects, initial encounter: Secondary | ICD-10-CM | POA: Insufficient documentation

## 2022-03-03 DIAGNOSIS — S060X0A Concussion without loss of consciousness, initial encounter: Secondary | ICD-10-CM | POA: Insufficient documentation

## 2022-03-03 DIAGNOSIS — S0990XA Unspecified injury of head, initial encounter: Secondary | ICD-10-CM | POA: Diagnosis present

## 2022-03-03 MED ORDER — BUTALBITAL-APAP-CAFFEINE 50-325-40 MG PO TABS: 1.0000 | ORAL_TABLET | Freq: Four times a day (QID) | ORAL | 0 refills | Status: AC | PRN

## 2022-03-03 NOTE — Discharge Instructions (Signed)
You were seen today for headache, blurry vision, lightheadedness and nausea status post head injury.  You have been diagnosed with a concussion.  You need as much brain rest as possible.  I have sent in prescription for Fioricet to take as needed for headaches.  Please follow-up with your student [facility] if symptoms persist

## 2022-03-03 NOTE — ED Provider Notes (Signed)
[facility] EMERGENCY DEPARTMENT Provider Note   CSN: [id number] Arrival date & time: 03/03/22  1423     History  Chief Complaint  Patient presents with   Headache    Jillian Wagner is a 21 y.o. female presents to the ER today with complaint of headache, blurry vision, lightheadedness and nausea.  She reports this started 3 days ago after hitting the back of her head on a bunk bed.  She reports she did not lose consciousness.  She denies neck pain.  She has not had any vomiting.  The headache is located in her forehead.  She describes the pain as pressure.  She denies double vision, syncope or vomiting.  She has never had a head injury prior.  She has tried Ibuprofen and Excedrin migraine OTC with some relief of symptoms.      Home Medications Prior to Admission medications   Medication Sig Start Date End Date Taking? Authorizing Provider  butalbital-acetaminophen-caffeine (FIORICET) 50-325-40 MG tablet Take 1-2 tablets by mouth every 6 (six) hours as needed for headache. 03/03/22 03/03/23 Yes Natalee Tomkiewicz, Salvadore Oxford, NP  albuterol (VENTOLIN HFA) 108 (90 Base) MCG/ACT inhaler Inhale 2 puffs into the lungs every 6 (six) hours as needed. Patient not taking: Reported on 07/06/2021 02/27/21   [provider]  amitriptyline (ELAVIL) 10 MG tablet Take 2 tablets by mouth at bedtime. 07/27/20   [provider]  buPROPion (WELLBUTRIN XL) 300 MG 24 hr tablet  07/20/21   [provider]  EPINEPHrine 0.3 mg/0.3 mL IJ SOAJ injection Inject into the muscle. Patient not taking: Reported on 05/25/2021 01/21/20   [provider]  levocetirizine (XYZAL) 5 MG tablet Take 5 mg by mouth every evening.    [provider]  Norgestimate-Ethinyl Estradiol Triphasic (TRI-LO-MARZIA) 0.18/0.215/0.25 MG-25 MCG tab 1 tablet    [provider]  TRI-ESTARYLLA 0.18/0.215/0.25 MG-35 MCG tablet Take 1 tablet by mouth daily. 02/06/21   [provider]  vitamin C (ASCORBIC ACID) 500 MG tablet Take 500 mg by mouth daily.    [provider]      Allergies    Garlic and Potassium citrate    Review of Systems   Review of Systems   Past Medical History:  Diagnosis Date   Anemia    Positive ANA (antinuclear antibody)     No current facility-administered medications for this encounter.   Current Outpatient Medications  Medication Sig Dispense Refill   butalbital-acetaminophen-caffeine (FIORICET) 50-325-40 MG tablet Take 1-2 tablets by mouth every 6 (six) hours as needed for headache. 20 tablet 0   albuterol (VENTOLIN HFA) 108 (90 Base) MCG/ACT inhaler Inhale 2 puffs into the lungs every 6 (six) hours as needed. (Patient not taking: Reported on 07/06/2021)     amitriptyline (ELAVIL) 10 MG tablet Take 2 tablets by mouth at bedtime.     buPROPion (WELLBUTRIN XL) 300 MG 24 hr tablet      EPINEPHrine 0.3 mg/0.3 mL IJ SOAJ injection Inject into the muscle. (Patient not taking: Reported on 05/25/2021)     levocetirizine (XYZAL) 5 MG tablet Take 5 mg by mouth every evening.     Norgestimate-Ethinyl Estradiol Triphasic (TRI-LO-MARZIA) 0.18/0.215/0.25 MG-25 MCG tab 1 tablet     TRI-ESTARYLLA 0.18/0.215/0.25 MG-35 MCG tablet Take 1 tablet by mouth daily.     vitamin C (ASCORBIC ACID) 500 MG tablet Take 500 mg by mouth daily.      Allergies  Allergen Reactions   Garlic Nausea And Vomiting  Potassium Citrate Nausea And Vomiting    Family History  Problem Relation Age of Onset   Raynaud syndrome Mother    Other Mother        epitaxis   Asthma Father    Other Sister        GERD   Psoriasis Maternal Grandmother    Celiac disease Maternal Grandmother    Ulcerative colitis Paternal Grandmother    Arthritis-Osteo Paternal Grandfather     Social History   Socioeconomic History   Marital status: Single    Spouse name: Not on file   Number of children: Not on file   Years of education: Not on file   Highest education level:  Not on file  Occupational History   Not on file  Tobacco Use   Smoking status: Never    Passive exposure: Never   Smokeless tobacco: Never  Vaping Use   Vaping Use: Never used  Substance and Sexual Activity   Alcohol use: Not Currently   Drug use: Never   Sexual activity: Not Currently  Other Topics Concern   Not on file  Social History Narrative   Not on file   Social Determinants of Health   Financial Resource Strain: Not on file  Food Insecurity: Not on file  Transportation Needs: Not on file  Physical Activity: Not on file  Stress: Not on file  Social Connections: Not on file  Intimate Partner Violence: Not on file     Constitutional: Patient reports headache.  Denies fever, malaise, fatigue, or abrupt weight changes.  HEENT: Patient reports blurred vision.  Denies eye pain, eye redness, ear pain, ringing in the ears, wax buildup, runny nose, nasal congestion, bloody nose, or sore throat. Respiratory: Denies difficulty breathing, shortness of breath, cough or sputum production.   Cardiovascular: Denies chest pain, chest tightness, palpitations or swelling in the hands or feet.  Gastrointestinal: Patient reports nausea.  Denies abdominal pain, bloating, constipation, diarrhea or blood in the stool.  Musculoskeletal: Denies decrease in range of motion, difficulty with gait, muscle pain or joint pain and swelling.  Skin: Denies redness, rashes, lesions or ulcercations.  Neurological: Patient reports lightheadedness.  Denies difficulty with memory, difficulty with speech or problems with balance and coordination.    No other specific complaints in a complete review of systems (except as listed in HPI above).   Physical Exam Updated Vital Signs BP (!) 137/90   Pulse 72   Temp 98.6 F (37 C) (Oral)   Resp 16   Ht 5\' 7"  (1.702 m)   Wt 61.2 kg   SpO2 99%   BMI 21.14 kg/m  Physical Exam  BP (!) 137/90   Pulse 72   Temp 98.6 F (37 C) (Oral)   Resp 16   Ht 5'  7" (1.702 m)   Wt 61.2 kg   SpO2 99%   BMI 21.14 kg/m  Wt Readings from Last 3 Encounters:  03/03/22 61.2 kg (62 %, Z= 0.29)*  05/25/21 64 kg (73 %, Z= 0.60)*  03/03/21 61.2 kg (65 %, Z= 0.39)*   * Growth percentiles are based on CDC (Girls, 2-20 Years) data.    General: Appears her stated age, well developed, well nourished in NAD. Skin: Warm, dry and intact. No bruising, abrasion or laceration noted. HEENT: Head: normal shape and size; Eyes: sclera white, no icterus, conjunctiva pink, PERRLA and EOMs intact;  Cardiovascular: Normal rate and rhythm. Pulmonary/Chest: Normal effort. Musculoskeletal: Normal flexion, extension, rotation and lateral bending of  the cervical spine.  No bony tenderness noted over the cervical spine.  No difficulty with gait. Neurological: Alert and oriented. Cranial nerves II-XII grossly intact. Coordination normal.     ED Results / Procedures / Treatments    Radiology No results found.   Medications Ordered in ED Medications - No data to display  ED Course/ Medical Decision Making/ A&P   Acute Headache, Lightheadedness, Blurred Vision, Nausea:  DDx include minor head injury, concussion Exam is consistent with concussion Discussed CT head however there is very low suspicion for scalp fracture, intracranial bleeding given the fact that she is not on any blood thinners.  She is agreeable to holding off on head CT at this time Rx for Fioricet to take as needed for headache Encourage brain rest School note provided She will follow-up with student health as needed if symptoms persist  Final Clinical Impression(s) / ED Diagnoses Final diagnoses:  Concussion without loss of consciousness, initial encounter    Rx / DC Orders ED Discharge Orders          Ordered    butalbital-acetaminophen-caffeine (FIORICET) 50-325-40 MG tablet  Every 6 hours PRN        03/03/22 1637              Lorre Munroe, NP 03/03/22 1638    Chesley Noon, MD 03/03/22 2001

## 2022-03-03 NOTE — ED Triage Notes (Signed)
Pt presents via POV c/o headache since Friday. Reports hit head while on school retreat. Reports continued headache, blurred vision, and sensitivity to light. Ambulatory to triage, A&O x4.

## 2022-03-04 ENCOUNTER — Ambulatory Visit: Payer: 59

## 2022-03-12 ENCOUNTER — Other Ambulatory Visit: Payer: Self-pay

## 2022-03-12 ENCOUNTER — Ambulatory Visit (INDEPENDENT_AMBULATORY_CARE_PROVIDER_SITE_OTHER): Payer: 59 | Admitting: Family Medicine

## 2022-03-12 ENCOUNTER — Encounter: Payer: Self-pay | Admitting: Family Medicine

## 2022-03-12 VITALS — BP 108/70 | HR 113 | Temp 97.1°F | Resp 18 | Ht 65.0 in | Wt 144.0 lb

## 2022-03-12 DIAGNOSIS — S069X0D Unspecified intracranial injury without loss of consciousness, subsequent encounter: Secondary | ICD-10-CM

## 2022-03-12 NOTE — Progress Notes (Signed)
[facility] [address] [place], [place] [address] Phone: [phone] Fax: [phone]   Office Visit Note  Patient Name: Jillian Wagner  Date of Birth [id number]  Med Record Number [id number]  Date of Service: 03/12/2022  Chief Complaint  Patient presents with   Concussion    Vital Signs: BP 108/70   Pulse (!) 113   Temp (!) 97.1 F (36.2 C) (Tympanic)   Resp 18   Ht 5\' 5"  (1.651 m)   Wt 144 lb (65.3 kg)   SpO2 99%   BMI 23.96 kg/m   Garlic and Potassium citrate  Current Medication:  Outpatient Encounter Medications as of 03/12/2022  Medication Sig   amitriptyline (ELAVIL) 10 MG tablet Take 2 tablets by mouth at bedtime.   butalbital-acetaminophen-caffeine (FIORICET) 50-325-40 MG tablet Take 1-2 tablets by mouth every 6 (six) hours as needed for headache.   levocetirizine (XYZAL) 5 MG tablet Take 5 mg by mouth every evening.   Norgestimate-Ethinyl Estradiol Triphasic (TRI-LO-MARZIA) 0.18/0.215/0.25 MG-25 MCG tab 1 tablet   vitamin C (ASCORBIC ACID) 500 MG tablet Take 500 mg by mouth daily.   albuterol (VENTOLIN HFA) 108 (90 Base) MCG/ACT inhaler Inhale 2 puffs into the lungs every 6 (six) hours as needed.   buPROPion (WELLBUTRIN XL) 300 MG 24 hr tablet    EPINEPHrine 0.3 mg/0.3 mL IJ SOAJ injection Inject into the muscle.   TRI-ESTARYLLA 0.18/0.215/0.25 MG-35 MCG tablet Take 1 tablet by mouth daily.   No facility-administered encounter medications on file as of 03/12/2022.    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 No Evidence of Intracranial injury or skill fracture? No Location of impact Occipital Causes Other sat up in bed and hit back of head  Amnesia Before (Retrograde) Are there events just BEFORE the injury that you/person has no memory of (even brief)? No Amnesia After (Anterograde) Are there any events just AFTER the injury that you/person has no memory of (even brief)? No Loss of Consciousness No Early signs: N/A Seizures: No  Headache: 1  Nausea: 1  Vomiting: 0  Balance Difficulty: 1  Dizziness: 0  Visual Problems:1  Fatigue: 1  Sensitivity to light: 1  Sensitivity to noise: 0  Numbness/Tingling: 0    Daytime Drowsiness: 1  Sleep Less Than Usual: 1  Sleep More Than Usual: 0  Trouble Falling Asleep: 1    Irritability: 1  Sadness: 1  Feeling More Emotional: 1  Nervousness: 0    Feeling Mentally Foggy: 0  Feeling Slowed Down: 1  Difficulty Concentrating: 1  Difficulty Remembering: 1   Total Symptom Score: 15  Does Exertion/Physical Activity makes symptoms worse? Yes Does Cognitive Activity (reading, writing, studying) make symptoms worse?Yes Overall Rating: How different is the person acting compared to his/her usual self? 3  Risk Factors for Protracted Recovery  History of Concussion? No How many Previous Concussions?  0 Longest symptom duration N/A If multiple concussions, was less force needed to cause reinjury? No History of Headaches?No Prior treatment for headaches?No Personal History of Migraines?No Family History of MigrainesNo   History of Learning Disabilities?No History of ADD or ADHD?No Other Developmental disorders? No   History of Anxiety?No History of Depression?Yes Sleep Disorder?Yes Other Psychiatric DisorderNo    Physical EXAM Physical Exam Vitals reviewed.  Constitutional:       Appearance: Normal appearance. She is not ill-appearing.  Neurological:     General: No focal deficit present.     Mental Status: She is alert and oriented to person, place, and time.     Cranial Nerves: Cranial nerves 2-12 are intact.     Motor: Motor function is intact.     Coordination: Coordination is intact. Romberg sign negative. Coordination normal. Finger-Nose-Finger Test normal.     Gait: Gait is intact. Tandem walk normal.     Comments: Unsteady single leg stance - minimal     Assessment/Plan:  1. Traumatic brain injury, without loss of consciousness, subsequent encounter Follow-up Friday with me Return to class - takes breaks every 20 minutes or so if headache worsens, wear sunglasses  in classroom May increase her amitriptyline to 4 tablets if needed (currently taking 30mg  at bedtime)  No physical activity excpet walking until cleared    General Counseling: beckie viscardi understanding of the findings of todays visit and agrees with plan of treatment. I have discussed any further diagnostic evaluation that may be needed or ordered today. We also reviewed her medications today. she has been encouraged to call the office with any questions or concerns that should arise related to todays visit.   No orders of the defined types were placed in this encounter.   No orders of the defined types were placed in this encounter.    Dr Pryor Curia Billijo Dilling ABFM University Physician

## 2022-03-15 ENCOUNTER — Encounter: Payer: Self-pay | Admitting: Family Medicine

## 2022-03-15 ENCOUNTER — Ambulatory Visit (INDEPENDENT_AMBULATORY_CARE_PROVIDER_SITE_OTHER): Payer: 59 | Admitting: Family Medicine

## 2022-03-15 VITALS — BP 118/70 | Resp 18 | Ht 65.0 in | Wt 144.0 lb

## 2022-03-15 DIAGNOSIS — S069X0D Unspecified intracranial injury without loss of consciousness, subsequent encounter: Secondary | ICD-10-CM

## 2022-03-15 NOTE — Progress Notes (Signed)
[facility] [address] [place], [place] [address] Phone: [phone] Fax: [phone]   Office Visit Note  Patient Name: Jillian Wagner  Date of Birth [id number]  Med Record Number [id number]  Date of Service: 03/15/2022  Chief Complaint  Patient presents with   Follow-up    Vital Signs: BP 118/70   Resp 18   Ht 5\' 5"  (1.651 m)   Wt 144 lb (65.3 kg)   BMI 23.96 kg/m   Garlic and Potassium citrate  Current Medication:  Outpatient Encounter Medications as of 03/15/2022  Medication Sig   amitriptyline (ELAVIL) 10 MG tablet Take 2 tablets by mouth at bedtime.   butalbital-acetaminophen-caffeine (FIORICET) 50-325-40 MG tablet Take 1-2 tablets by mouth every 6 (six) hours as needed for headache.   levocetirizine (XYZAL) 5 MG tablet Take 5 mg by mouth every evening.   Norgestimate-Ethinyl Estradiol Triphasic (TRI-LO-MARZIA) 0.18/0.215/0.25 MG-25 MCG tab 1 tablet   vitamin C (ASCORBIC ACID) 500 MG tablet Take 500 mg by mouth daily.   albuterol (VENTOLIN HFA) 108 (90 Base) MCG/ACT inhaler Inhale 2 puffs into the lungs every 6 (six) hours as needed.   buPROPion (WELLBUTRIN XL) 300 MG 24 hr tablet    EPINEPHrine 0.3 mg/0.3 mL IJ SOAJ injection Inject into the muscle.   No facility-administered encounter medications on file as of 03/15/2022.    Medical History: Past Medical History:  Diagnosis Date   Anemia    Positive ANA (antinuclear antibody)     Acute Concussion Evaluation (ACE)  Date/Time of Injury 03/01/2022 Reported by Patient  Injury Description hit head on bunk bed  Is there evidence of a forcible blow to the head (direct or indirect)? No Evidence of Intracranial injury or skill fracture? No Location of impact Occipital Causes Other accident   Amnesia Before (Retrograde) Are there events just BEFORE the injury that you/person has no memory of (even brief)? No Amnesia After (Anterograde) Are there any events just AFTER the injury that  you/person has no memory of (even brief)? No Loss of Consciousness No Early signs: N/A Seizures: No  Headache: 1  Nausea: 1  Vomiting: 0  Balance Difficulty: 0  Dizziness: 0  Visual Problems:0  Fatigue: 1  Sensitivity to light: 1  Sensitivity to noise: 0  Numbness/Tingling: 0    Daytime Drowsiness: 0  Sleep Less Than Usual: 0  Sleep More Than Usual: 0  Trouble Falling Asleep: 1    Irritability: 0  Sadness: 0  Feeling More Emotional: 1  Nervousness: 0    Feeling Mentally Foggy: 0  Feeling Slowed Down: 0  Difficulty Concentrating: 1  Difficulty Remembering: 0   Total Symptom Score: 7  Does Exertion/Physical Activity makes symptoms worse? Yes Does Cognitive Activity (reading, writing, studying) make symptoms worse?Yes Overall Rating: How different is the person acting compared to his/her usual self? 2  Risk Factors for Protracted Recovery  History of Concussion? No How many Previous Concussions? 0 Longest symptom duration N/A If multiple concussions, was less force needed to cause reinjury? No History of Headaches?No Prior treatment for headaches?No Personal History of Migraines?No Family History of MigrainesNo   History of Learning Disabilities?No History of ADD or ADHD?No Other Developmental disorders? No   History of Anxiety?No History of Depression?Yes Sleep Disorder?Yes Other Psychiatric DisorderNo    Physical EXAM Physical Exam Constitutional:      Appearance: Normal appearance.  Eyes:     Extraocular Movements: Extraocular movements intact.     Right eye: No nystagmus.  Left eye: No nystagmus.     Pupils: Pupils are equal, round, and reactive to light.     Funduscopic exam:    Right eye: No papilledema.        Left eye: No papilledema.  Neurological:     General: No focal deficit present.     Mental Status: She is alert.     Cranial Nerves: Cranial nerves 2-12 are intact.     Motor: Motor function is intact.     Coordination:  Coordination is intact. Romberg sign negative. Coordination normal. Finger-Nose-Finger Test normal.     Gait: Gait is intact. Tandem walk normal.     Comments: Still unsteady single leg stance - falls to the left  Psychiatric:        Attention and Perception: Attention and perception normal.        Mood and Affect: Mood and affect normal.        Speech: Speech normal.        Behavior: Behavior normal. Behavior is cooperative.        Thought Content: Thought content normal.        Cognition and Memory: Cognition and memory normal.        Judgment: Judgment normal.     Assessment/Plan:  1. Traumatic brain injury, without loss of consciousness, subsequent encounter Follow up next week with her home PCP  I will fax her notes from today and last visit so he has them for review and comparison (Dr Jonna Clark [phone]))     General Counseling: Pryor Curia understanding of the findings of todays visit and agrees with plan of treatment. I have discussed any further diagnostic evaluation that may be needed or ordered today. We also reviewed her medications today. she has been encouraged to call the office with any questions or concerns that should arise related to todays visit.   No orders of the defined types were placed in this encounter.   No orders of the defined types were placed in this encounter.                                                                                              [phone]    Dr Durwin Reges Ajax Schroll ABFM University Physician

## 2022-04-04 ENCOUNTER — Ambulatory Visit (INDEPENDENT_AMBULATORY_CARE_PROVIDER_SITE_OTHER): Payer: 59 | Admitting: Oncology

## 2022-04-04 ENCOUNTER — Other Ambulatory Visit: Payer: Self-pay

## 2022-04-04 ENCOUNTER — Ambulatory Visit: Payer: 59

## 2022-04-04 ENCOUNTER — Encounter: Payer: Self-pay | Admitting: Oncology

## 2022-04-04 VITALS — BP 124/70 | HR 103 | Temp 96.9°F | Resp 18 | Ht 65.0 in | Wt 143.0 lb

## 2022-04-04 DIAGNOSIS — H9202 Otalgia, left ear: Secondary | ICD-10-CM

## 2022-04-04 MED ORDER — PREDNISONE 20 MG PO TABS: 40.0000 mg | ORAL_TABLET | Freq: Every day | ORAL | 0 refills | Status: DC

## 2022-04-04 MED ORDER — AMOXICILLIN-POT CLAVULANATE 875-125 MG PO TABS: 1.0000 | ORAL_TABLET | Freq: Two times a day (BID) | ORAL | 0 refills | Status: DC

## 2022-04-04 NOTE — Progress Notes (Signed)
[facility] 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 Yesterday pain worsened. Feels like it won't "pop". Yesterday left jaw and chin felt sensitive. Has been taking sudafed and mucinex. Has associated cough and some slightly nasal congestion. No fevers. No body aches. Gets ear infections frequently.   Current Medication:  Outpatient Encounter Medications as of 04/04/2022  Medication Sig   amitriptyline (ELAVIL) 10 MG tablet Take 2 tablets by mouth at bedtime.   butalbital-acetaminophen-caffeine (FIORICET) 50-325-40 MG tablet Take 1-2 tablets by mouth every 6 (six) hours as needed for headache.   levocetirizine (XYZAL) 5 MG tablet Take 5 mg by mouth every evening.   Norgestimate-Ethinyl Estradiol Triphasic (TRI-LO-MARZIA) 0.18/0.215/0.25 MG-25 MCG tab 1 tablet   vitamin C (ASCORBIC ACID) 500 MG tablet Take 500 mg by mouth daily.   albuterol (VENTOLIN HFA) 108 (90 Base) MCG/ACT inhaler Inhale 2 puffs into the lungs every 6 (six) hours as needed.   buPROPion (WELLBUTRIN XL) 300 MG 24 hr tablet    EPINEPHrine 0.3 mg/0.3 mL IJ SOAJ injection Inject into the muscle.   No facility-administered encounter medications on file as of 04/04/2022.   Medical History: Past Medical History:  Diagnosis Date   Anemia    Positive ANA (antinuclear antibody)    Vital Signs: There were no vitals taken for this visit.  ROS: As per HPI.  All other pertinent ROS negative.     Review of Systems  HENT:  Positive for congestion, ear pain and sinus pressure.   Respiratory:  Positive for cough.   Gastrointestinal:  Negative for diarrhea, nausea and vomiting.    Physical Exam HENT:     Right Ear: Tympanic membrane normal.     Left Ear: A middle ear effusion is present. Tympanic membrane is injected and erythematous.     Nose: Congestion and rhinorrhea present. Rhinorrhea is clear.     Right Turbinates: Swollen.     Left Turbinates: Swollen.     Right Sinus: No maxillary sinus tenderness or frontal sinus tenderness.     Left Sinus: No maxillary sinus tenderness or frontal sinus tenderness.     Mouth/Throat:     Mouth: Mucous membranes are moist.     Pharynx: Posterior oropharyngeal erythema present.  Cardiovascular:     Rate and Rhythm: Normal rate and regular rhythm.  Pulmonary:     Effort: Pulmonary effort is normal.     Breath sounds: Normal breath sounds.  Lymphadenopathy:     Head:     Right side of head: Tonsillar adenopathy present.     Left side of head: No tonsillar adenopathy.     Cervical: Cervical adenopathy present.    No results found for this or any previous visit (from the past 24 hour(s)).  Assessment/Plan: 1. Ear pain, left -Exam concerning for left otitis media.  Recommend Augmentin twice daily x 7 days.  Continue Mucinex DM every 12 hours and add in Flonase 2 sprays each nostril daily.  Given you are flying an airplane on Saturday morning would recommend 40 mg prednisone each morning x 4 days.  Discussed side effects of both medications and how and when  to take.  Take both with food.  Provided written and verbal instructions via MyChart and while in clinic.  - amoxicillin-clavulanate (AUGMENTIN) 875-125 MG tablet; Take 1 tablet by mouth 2 (two) times daily.  Dispense: 14 tablet; Refill: 0 - predniSONE (DELTASONE) 20 MG tablet; Take 2 tablets (40 mg total) by mouth daily with breakfast.  Dispense: 8 tablet; Refill: 0   Disposition-return to clinic as needed.  General Counseling: Jillian Wagner understanding of the findings of todays visit and agrees with plan of treatment. I have discussed any further diagnostic  evaluation that may be needed or ordered today. We also reviewed her medications today. she has been encouraged to call the office with any questions or concerns that should arise related to todays visit.   No orders of the defined types were placed in this encounter.   No orders of the defined types were placed in this encounter.  I spent 20 minutes dedicated to the care of this patient (face-to-face and non-face-to-face) on the date of the encounter to include what is described in the assessment and plan.  Durenda Hurt, NP 04/04/2022 1:34 PM

## 2022-04-05 ENCOUNTER — Encounter: Payer: Self-pay | Admitting: Oncology

## 2022-05-02 ENCOUNTER — Other Ambulatory Visit: Payer: Self-pay

## 2022-05-02 DIAGNOSIS — D508 Other iron deficiency anemias: Secondary | ICD-10-CM

## 2022-05-03 ENCOUNTER — Inpatient Hospital Stay: Payer: 59 | Attending: Internal Medicine

## 2022-05-03 ENCOUNTER — Inpatient Hospital Stay: Payer: 59

## 2022-05-03 DIAGNOSIS — Z840 Family history of diseases of the skin and subcutaneous tissue: Secondary | ICD-10-CM | POA: Insufficient documentation

## 2022-05-03 DIAGNOSIS — N920 Excessive and frequent menstruation with regular cycle: Secondary | ICD-10-CM | POA: Diagnosis not present

## 2022-05-03 DIAGNOSIS — D508 Other iron deficiency anemias: Secondary | ICD-10-CM

## 2022-05-03 DIAGNOSIS — D509 Iron deficiency anemia, unspecified: Secondary | ICD-10-CM | POA: Insufficient documentation

## 2022-05-03 DIAGNOSIS — Z8261 Family history of arthritis: Secondary | ICD-10-CM | POA: Diagnosis not present

## 2022-05-03 DIAGNOSIS — R5383 Other fatigue: Secondary | ICD-10-CM | POA: Insufficient documentation

## 2022-05-03 DIAGNOSIS — Z8379 Family history of other diseases of the digestive system: Secondary | ICD-10-CM | POA: Diagnosis not present

## 2022-05-03 DIAGNOSIS — Z825 Family history of asthma and other chronic lower respiratory diseases: Secondary | ICD-10-CM | POA: Diagnosis not present

## 2022-05-03 DIAGNOSIS — Z79899 Other long term (current) drug therapy: Secondary | ICD-10-CM | POA: Diagnosis not present

## 2022-05-03 DIAGNOSIS — E538 Deficiency of other specified B group vitamins: Secondary | ICD-10-CM | POA: Diagnosis present

## 2022-05-03 LAB — CBC WITH DIFFERENTIAL/PLATELET
Abs Immature Granulocytes: 0.02 10*3/uL (ref 0.00–0.07)
Basophils Absolute: 0 10*3/uL (ref 0.0–0.1)
Basophils Relative: 0 %
Eosinophils Absolute: 0.4 10*3/uL (ref 0.0–0.5)
Eosinophils Relative: 5 %
HCT: 38.9 % (ref 36.0–46.0)
Hemoglobin: 13.1 g/dL (ref 12.0–15.0)
Immature Granulocytes: 0 %
Lymphocytes Relative: 47 %
Lymphs Abs: 3.5 10*3/uL (ref 0.7–4.0)
MCH: 29.6 pg (ref 26.0–34.0)
MCHC: 33.7 g/dL (ref 30.0–36.0)
MCV: 87.8 fL (ref 80.0–100.0)
Monocytes Absolute: 0.5 10*3/uL (ref 0.1–1.0)
Monocytes Relative: 7 %
Neutro Abs: 3.1 10*3/uL (ref 1.7–7.7)
Neutrophils Relative %: 41 %
Platelets: 368 10*3/uL (ref 150–400)
RBC: 4.43 MIL/uL (ref 3.87–5.11)
RDW: 12.6 % (ref 11.5–15.5)
WBC: 7.5 10*3/uL (ref 4.0–10.5)
nRBC: 0 % (ref 0.0–0.2)

## 2022-05-03 LAB — FERRITIN: Ferritin: 68 ng/mL (ref 11–307)

## 2022-05-03 LAB — VITAMIN B12: Vitamin B-12: 260 pg/mL (ref 180–914)

## 2022-05-03 LAB — IRON AND TIBC
Iron: 129 ug/dL (ref 28–170)
Saturation Ratios: 29 % (ref 10.4–31.8)
TIBC: 452 ug/dL — ABNORMAL HIGH (ref 250–450)
UIBC: 323 ug/dL

## 2022-05-07 ENCOUNTER — Inpatient Hospital Stay (HOSPITAL_BASED_OUTPATIENT_CLINIC_OR_DEPARTMENT_OTHER): Payer: 59 | Admitting: Nurse Practitioner

## 2022-05-07 ENCOUNTER — Encounter: Payer: Self-pay | Admitting: Nurse Practitioner

## 2022-05-07 ENCOUNTER — Telehealth: Payer: 59 | Admitting: Nurse Practitioner

## 2022-05-07 DIAGNOSIS — D508 Other iron deficiency anemias: Secondary | ICD-10-CM

## 2022-05-07 DIAGNOSIS — E538 Deficiency of other specified B group vitamins: Secondary | ICD-10-CM | POA: Diagnosis not present

## 2022-05-07 MED ORDER — CYANOCOBALAMIN 1000 MCG/ML IJ SOLN: 1000.0000 ug | INTRAMUSCULAR | 1 refills | Status: DC

## 2022-05-07 MED ORDER — "BD SAFETYGLIDE SYRINGE/NEEDLE 25G X 1"" 3 ML MISC": 1 refills | Status: DC

## 2022-05-07 NOTE — Progress Notes (Signed)
[facility] at [facility]  Virtual Visit Progress Note   I connected with [name] on 05/07/22 at 11:30 AM EST by video enabled telemedicine visit and verified that I am speaking with the correct person using two identifiers.   I discussed the limitations, risks, security and privacy concerns of performing an evaluation and management service by telemedicine and the availability of in-person appointments. I also discussed with the patient that there may be a patient responsible charge related to this service. The patient expressed understanding and agreed to proceed.   Other persons participating in the visit and their role in the encounter: None  Patient's location: dorm room Provider's location: Clinic     Patient Care Team: Pcp, No as PCP - General Earna Coder, MD as Consulting Physician (Hematology and Oncology)   Name of the patient: Jillian Wagner  [id number]  07-14-2000   Chief Complaint: IDA  Date of visit: 05/07/22  History of present illness: She reports that her menstrual cycles are not particularly heavy and the last for about 5 days. She has had EGD and colonoscopy in the past as well.  She has a history of cyclical vomiting syndrome.  Denies any consistent use of NSAIDs.  Denies any blood loss in her stool or urine. Denies any dark melanotic stools.  She is currently on oral iron and has taken it for many months.  Her most recent hemoglobin was 11.5 in December 2022 with a ferritin level of 8 and iron saturation of 9% with an elevated TIBC of 530.  Patient currently reports feeling fatigued but denies other complaints.  She has received IV iron in the past.   Interval history: On OCPs, has periods that last 6 days. On heaviest day, saturates around 6-7 heavy tampons/day. She has been taking oral b12. Previously no improvement in her iron levels with oral iron. She has some fatigue that is mild. No shortness of breath. She will be  traveling to [place] for spring semester as part of her early education degree at [employer]. She is currently in her Jan Term/mini-mester.   Review of systems- Review of Systems  Constitutional:  Positive for malaise/fatigue. Negative for chills, diaphoresis, fever and weight loss.  HENT:  Negative for congestion, ear discharge, ear pain, nosebleeds, sinus pain and sore throat.   Eyes:  Negative for pain and redness.  Respiratory:  Negative for cough, hemoptysis, sputum production, shortness of breath, wheezing and stridor.   Cardiovascular:  Negative for chest pain, palpitations and leg swelling.  Gastrointestinal:  Negative for abdominal pain, constipation, diarrhea, nausea and vomiting.  Musculoskeletal:  Negative for falls, joint pain and myalgias.  Skin:  Negative for rash.  Neurological:  Negative for dizziness, loss of consciousness, weakness and headaches.  Psychiatric/Behavioral:  Negative for depression. The patient is not nervous/anxious and does not have insomnia.   All other systems reviewed and are negative.    Allergies  Allergen Reactions  . Garlic Nausea And Vomiting  . Potassium Citrate Nausea And Vomiting    Past Medical History:  Diagnosis Date  . Anemia   . Positive ANA (antinuclear antibody)     History reviewed. No pertinent surgical history.  Social History   Socioeconomic History  . Marital status: Single    Spouse name: Not on file  . Number of children: Not on file  . Years of education: Not on file  . Highest education level: Not on file  Occupational History  . Not on  file  Tobacco Use  . Smoking status: Never    Passive exposure: Never  . Smokeless tobacco: Never  Vaping Use  . Vaping Use: Never used  Substance and Sexual Activity  . Alcohol use: Not Currently  . Drug use: Never  . Sexual activity: Not Currently  Other Topics Concern  . Not on file  Social History Narrative  . Not on file   Social Determinants of Health   Financial  Resource Strain: Not on file  Food Insecurity: Not on file  Transportation Needs: Not on file  Physical Activity: Not on file  Stress: Not on file  Social Connections: Not on file  Intimate Partner Violence: Not on file    Immunization History  Administered Date(s) Administered  . HPV 9-valent 12/15/2014, 03/27/2015, 04/18/2016  . Influenza,inj,Quad PF,6+ Mos 02/06/2016, 01/28/2021  . PFIZER(Purple Top)SARS-COV-2 Vaccination 06/25/2019, 07/16/2019, 03/31/2020  . 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 Tdap 09/14/2013    Family History  Problem Relation Age of Onset  . Raynaud syndrome Mother   . Other Mother        epitaxis  . Asthma Father   . Other Sister        GERD  . Psoriasis Maternal Grandmother   . Celiac disease Maternal Grandmother   . Ulcerative colitis Paternal Grandmother   . Arthritis-Osteo Paternal Grandfather      Current Outpatient Medications:  .  amitriptyline (ELAVIL) 10 MG tablet, Take 2 tablets by mouth at bedtime., Disp: , Rfl:  .  amoxicillin-clavulanate (AUGMENTIN) 875-125 MG tablet, Take 1 tablet by mouth 2 (two) times daily., Disp: 14 tablet, Rfl: 0 .  butalbital-acetaminophen-caffeine (FIORICET) 50-325-40 MG tablet, Take 1-2 tablets by mouth every 6 (six) hours as needed for headache., Disp: 20 tablet, Rfl: 0 .  levocetirizine (XYZAL) 5 MG tablet, Take 5 mg by mouth every evening., Disp: , Rfl:  .  Norgestimate-Ethinyl Estradiol Triphasic (TRI-LO-MARZIA) 0.18/0.215/0.25 MG-25 MCG tab, 1 tablet, Disp: , Rfl:  .  predniSONE (DELTASONE) 20 MG tablet, Take 2 tablets (40 mg total) by mouth daily with breakfast., Disp: 8 tablet, Rfl: 0 .  promethazine (PHENERGAN) 12.5 MG tablet, Take 12.5 mg by mouth 3 (three) times daily as needed., Disp: , Rfl:  .  QELBREE 200 MG 24 hr capsule, , Disp: , Rfl:  .  vitamin C (ASCORBIC ACID) 500 MG tablet, Take 500 mg by mouth daily., Disp: , Rfl:  .  albuterol (VENTOLIN HFA) 108 (90 Base)  MCG/ACT inhaler, Inhale 2 puffs into the lungs every 6 (six) hours as needed., Disp: , Rfl:  .  buPROPion (WELLBUTRIN XL) 300 MG 24 hr tablet, , Disp: , Rfl:  .  EPINEPHrine 0.3 mg/0.3 mL IJ SOAJ injection, Inject into the muscle., Disp: , Rfl:   Physical exam: Exam limited due to telemedicine Physical Exam Constitutional:      General: She is not in acute distress. HENT:     Head: Normocephalic.  Pulmonary:     Effort: No respiratory distress.  Neurological:     Mental Status: She is alert and oriented to person, place, and time.  Psychiatric:        Mood and Affect: Mood normal.        Behavior: Behavior normal.      Latest Ref Rng & Units 05/03/2022    9:03 AM 12/17/2021   11:23 AM 07/04/2021    1:54 PM  CBC  WBC 4.0 - 10.5 K/uL 7.5  14.9  8.3   Hemoglobin 12.0 - 15.0 g/dL 13.1  12.8  13.2   Hematocrit 36.0 - 46.0 % 38.9  37.5  38.8   Platelets 150 - 400 K/uL 368  352  362       Latest Ref Rng & Units 03/01/2021   11:33 PM  CMP  Glucose 70 - 99 mg/dL 109   BUN 6 - 20 mg/dL 12   Creatinine 0.44 - 1.00 mg/dL 0.75   Sodium 135 - 145 mmol/L 137   Potassium 3.5 - 5.1 mmol/L 3.1   Chloride 98 - 111 mmol/L 104   CO2 22 - 32 mmol/L 25   Calcium 8.9 - 10.3 mg/dL 8.4   Total Protein 6.5 - 8.1 g/dL 7.7   Total Bilirubin 0.3 - 1.2 mg/dL 0.2   Alkaline Phos 38 - 126 U/L 77   AST 15 - 41 U/L 18   ALT 0 - 44 U/L 13    Iron/TIBC/Ferritin/ %Sat    Component Value Date/Time   IRON 129 05/03/2022 0903   TIBC 452 (H) 05/03/2022 0903   FERRITIN 68 05/03/2022 0903   IRONPCTSAT 29 05/03/2022 0903   Component Ref Range & Units 4 d ago 4 mo ago 10 mo ago  Vitamin B-12 180 - 914 pg/mL 260 212 CM 210 CM    Assessment and plan- Patient is a 23 y.o. female here for anemia follow-up     Visit Diagnosis No diagnosis found.  IDA:  B12 deficiency-  Heavy menses- currently on OCPs. Discussed that she may benefit from a different pill or avoiding withdrawal bleeds given her hx of iron  deficiency. She will speak to her gynecologist for options.   She will rtc in 1-2 weeks for first b12 injection and will receive teaching from nursing on self injection. I've sent prescription for b12, needles, and syringes to her pharmacy. She will be traveling to [place] for school and we will check her labs when she returns.    Chronic and stable today. We reviewed her recent laboratory results. I recommended that she maintain an iron rich diet especially iron rich foods first thing in the morning.  I would also recommend that she continue her B12 supplementation.  We discussed that oral B12 supplementation via pill form is often not as beneficial as droplet form.  She could trial this.  Goal would be for her to have daily supplementation before her next follow-up so we can see if she requires B12 injections instead which we discussed.  At this time no IV iron is needed.  She will return in 5 months prior to her study abroad in [place] with labs.   Patient expressed understanding and was in agreement with this plan. She also understands that She can call clinic at any time with any questions, concerns, or complaints.   I discussed the assessment and treatment plan with the patient. The patient was provided an opportunity to ask questions and all were answered. The patient agreed with the plan and demonstrated an understanding of the instructions.   The patient was advised to call back or seek an in-person evaluation if the symptoms worsen or if the condition fails to improve as anticipated.   I spent 10 minutes face-to-face video visit time dedicated to the care of this patient on the date of this encounter to include pre-visit review of recent and most recent past lab values, face-to-face time with the patient, and post visit ordering of testing/documentation.   Thank  you for allowing me to participate in the care of this very pleasant patient.   Clent Jacks PA-C [facility]  CC:

## 2022-05-07 NOTE — Progress Notes (Unsigned)
Patient called/ pre- screened for virtual appoinment today with oncologist. No new concerns at this time

## 2022-05-08 ENCOUNTER — Encounter: Payer: Self-pay | Admitting: Oncology

## 2022-05-16 ENCOUNTER — Inpatient Hospital Stay: Payer: 59

## 2022-05-16 DIAGNOSIS — D509 Iron deficiency anemia, unspecified: Secondary | ICD-10-CM | POA: Diagnosis not present

## 2022-05-16 DIAGNOSIS — D508 Other iron deficiency anemias: Secondary | ICD-10-CM

## 2022-05-16 MED ORDER — CYANOCOBALAMIN 1000 MCG/ML IJ SOLN
1000.0000 ug | Freq: Once | INTRAMUSCULAR | Status: AC
  Administered 2022-05-16: 1000 ug via INTRAMUSCULAR
  Filled 2022-05-16: qty 1

## 2022-05-16 NOTE — Progress Notes (Signed)
Instructions and education provided to patient on how to administer B12 injection. Verbalized understanding

## 2022-05-16 NOTE — Patient Instructions (Signed)

## 2022-05-17 ENCOUNTER — Telehealth: Payer: Self-pay | Admitting: *Deleted

## 2022-05-17 NOTE — Telephone Encounter (Signed)
Patient called reporting that this morning after getting her B12 injection yesterday is not feeing well. She is stating that her "body hurts all over", and that her temp is 100.5. She is asking if the B 12 is the cause because she did not do well with the iron infusion either. Please advise

## 2022-05-17 NOTE — Telephone Encounter (Signed)
Call returned to patient and advised of NP response and she thanked me for letting her know

## 2022-10-28 ENCOUNTER — Telehealth: Payer: Self-pay | Admitting: Nurse Practitioner

## 2022-10-28 NOTE — Telephone Encounter (Signed)
Pt wants to  know if she has to get her labs done here or if she can get them done where she is. Pt is only in [place] for school, and is not here full time of the year.

## 2022-10-30 NOTE — Telephone Encounter (Signed)
Attempted to reach patient to determine where she wanted her lab requisition faxed. No answer. Mychart msg sent to patient. Script needs Lauren's signature. Labs are not due until August.

## 2022-11-11 DIAGNOSIS — F909 Attention-deficit hyperactivity disorder, unspecified type: Secondary | ICD-10-CM | POA: Insufficient documentation

## 2022-11-19 NOTE — Telephone Encounter (Signed)
Lab faxed to [facility] in [place] per pt's request

## 2022-12-06 ENCOUNTER — Other Ambulatory Visit: Payer: 59

## 2022-12-10 ENCOUNTER — Encounter: Payer: Self-pay | Admitting: Nurse Practitioner

## 2022-12-10 ENCOUNTER — Telehealth: Payer: 59 | Admitting: Nurse Practitioner

## 2022-12-23 ENCOUNTER — Other Ambulatory Visit: Payer: 59

## 2022-12-26 ENCOUNTER — Inpatient Hospital Stay: Payer: 59 | Attending: Nurse Practitioner | Admitting: Nurse Practitioner

## 2022-12-26 ENCOUNTER — Encounter: Payer: Self-pay | Admitting: Nurse Practitioner

## 2022-12-26 DIAGNOSIS — E538 Deficiency of other specified B group vitamins: Secondary | ICD-10-CM

## 2022-12-26 DIAGNOSIS — F32A Depression, unspecified: Secondary | ICD-10-CM | POA: Insufficient documentation

## 2022-12-26 DIAGNOSIS — D508 Other iron deficiency anemias: Secondary | ICD-10-CM

## 2022-12-26 MED ORDER — CYANOCOBALAMIN 1000 MCG/ML IJ SOLN: 1000.0000 ug | INTRAMUSCULAR | 1 refills | Status: DC

## 2022-12-26 MED ORDER — "BD SAFETYGLIDE SYRINGE/NEEDLE 25G X 1"" 3 ML MISC": 1 refills | Status: DC

## 2022-12-26 NOTE — Progress Notes (Signed)
Patient experiencing headaches lately. Per patient is getting appt with Neuro to further evaluate.

## 2022-12-26 NOTE — Progress Notes (Signed)
[facility] at [facility]  Virtual Visit Progress Note   I connected with Jillian Wagner on 12/26/22 at 10:30 AM EDT by video enabled telemedicine visit and verified that I am speaking with the correct person using two identifiers.   I discussed the limitations, risks, security and privacy concerns of performing an evaluation and management service by telemedicine and the availability of in-person appointments. I also discussed with the patient that there may be a patient responsible charge related to this service. The patient expressed understanding and agreed to proceed.   Other persons participating in the visit and their role in the encounter: None  Patient's location: dorm room Provider's location: Clinic     Patient Care Team: Pcp, No as PCP - General Earna Coder, MD as Consulting Physician (Hematology and Oncology)   Name of the patient: Jillian Wagner  [id number]  12/15/2000   Chief Complaint: IDA  Date of visit: 12/26/22  History of present illness: She reports that her menstrual cycles are not particularly heavy and the last for about 5 days. She has had EGD and colonoscopy in the past as well.  She has a history of cyclical vomiting syndrome.  Denies any consistent use of NSAIDs.  Denies any blood loss in her stool or urine. Denies any dark melanotic stools.  She is currently on oral iron and has taken it for many months.  Her most recent hemoglobin was 11.5 in December 2022 with a ferritin level of 8 and iron saturation of 9% with an elevated TIBC of 530.  Patient currently reports feeling fatigued but denies other complaints.  She has received IV iron in the past.   Interval history: Patient agrees to evaluation via telemedicine for follow up of her iron and b12 deficiency. In interim, she has changed oral contraceptives and now has periods every 3 months and overall lighter flow. She continues monthly b12 injections. She has returned to  university for fall semester. She feels well. Denies fatigue. Denies any neurologic complaints. Denies recent fevers or illnesses. Denies any easy bleeding or bruising. No melena or hematochezia. No pica or restless leg. Reports good appetite and denies weight loss. Denies chest pain. Denies any nausea, vomiting, constipation, or diarrhea. Denies urinary complaints. Patient offers no further specific complaints today.  Review of systems- Review of Systems  Constitutional:  Negative for chills, diaphoresis, fever, malaise/fatigue and weight loss.  HENT:  Negative for congestion, ear discharge, ear pain, nosebleeds, sinus pain and sore throat.   Eyes:  Negative for pain and redness.  Respiratory:  Negative for cough, hemoptysis, sputum production, shortness of breath, wheezing and stridor.   Cardiovascular:  Negative for chest pain, palpitations and leg swelling.  Gastrointestinal:  Negative for abdominal pain, blood in stool, constipation, diarrhea, melena, nausea and vomiting.  Genitourinary:  Negative for hematuria.  Musculoskeletal:  Negative for falls, joint pain and myalgias.  Skin:  Negative for rash.  Neurological:  Positive for headaches. Negative for dizziness, loss of consciousness and weakness.  Psychiatric/Behavioral:  Negative for depression. The patient is not nervous/anxious and does not have insomnia.   All other systems reviewed and are negative.    Allergies  Allergen Reactions   Garlic Nausea And Vomiting   Potassium Citrate Nausea And Vomiting    Past Medical History:  Diagnosis Date   Anemia    Positive ANA (antinuclear antibody)    History reviewed. No pertinent surgical history.  Social History   Socioeconomic History   Marital  status: Single    Spouse name: Not on file   Number of children: Not on file   Years of education: Not on file   Highest education level: Not on file  Occupational History   Not on file  Tobacco Use   Smoking status: Never     Passive exposure: Never   Smokeless tobacco: Never  Vaping Use   Vaping status: Never Used  Substance and Sexual Activity   Alcohol use: Not Currently   Drug use: Never   Sexual activity: Not Currently  Other Topics Concern   Not on file  Social History Narrative   Not on file   Social Determinants of Health   Financial Resource Strain: Not on file  Food Insecurity: Not on file  Transportation Needs: Unknown (07/08/2019)   Received from [facility], [facility]   Transportation Needs    Lack reliable transportation: Not on file  Physical Activity: Not on file  Stress: Not on file  Social Connections: Unknown (07/08/2019)   Received from [facility], [facility]   Social Connections    Do your friends and family support you?: Not on file    What agencies support you?: Not on file  Intimate Partner Violence: Not on file    Immunization History  Administered Date(s) Administered   HPV 9-valent 12/15/2014, 03/27/2015, 04/18/2016   Influenza,inj,Quad PF,6+ Mos 02/06/2016, 01/28/2021   PFIZER(Purple Top)SARS-COV-2 Vaccination 06/25/2019, 07/16/2019, 03/31/2020   Pfizer Covid-19 Vaccine Bivalent Booster 40yrs & up 01/28/2021   Tdap 09/14/2013    Family History  Problem Relation Age of Onset   Raynaud syndrome Mother    Other Mother        epitaxis   Asthma Father    Other Sister        GERD   Psoriasis Maternal Grandmother    Celiac disease Maternal Grandmother    Ulcerative colitis Paternal Grandmother    Arthritis-Osteo Paternal Grandfather      Current Outpatient Medications:    amitriptyline (ELAVIL) 10 MG tablet, Take 2 tablets by mouth at bedtime., Disp: , Rfl:    amitriptyline (ELAVIL) 75 MG tablet, Take 75 mg by mouth at bedtime., Disp: , Rfl:    butalbital-acetaminophen-caffeine (FIORICET) 50-325-40 MG tablet, Take 1-2 tablets by mouth every 6 (six) hours as needed for headache., Disp: 20  tablet, Rfl: 0   cyanocobalamin (VITAMIN B12) 1000 MCG/ML injection, Inject 1 mL (1,000 mcg total) into the muscle every 30 (thirty) days. Use needle and syringe to inject 1018mcg/1ml into muscle once every 4 weeks, Disp: 10 mL, Rfl: 1   levocetirizine (XYZAL) 5 MG tablet, Take 5 mg by mouth every evening., Disp: , Rfl:    Norgestimate-Ethinyl Estradiol Triphasic (TRI-Wagner-MARZIA) 0.18/0.215/0.25 MG-25 MCG tab, 1 tablet, Disp: , Rfl:    promethazine (PHENERGAN) 12.5 MG tablet, Take 12.5 mg by mouth 3 (three) times daily as needed., Disp: , Rfl:    SYRINGE-NEEDLE, DISP, 3 ML (BD SAFETYGLIDE SYRINGE/NEEDLE) 25G X 1" 3 ML MISC, Use the needle and syringe for monthly b12 injection., Disp: 10 each, Rfl: 1   vitamin C (ASCORBIC ACID) 500 MG tablet, Take 500 mg by mouth daily., Disp: , Rfl:    ZOLMitriptan (ZOMIG) 2.5 MG tablet, Take by mouth., Disp: , Rfl:    albuterol (VENTOLIN HFA) 108 (90 Base) MCG/ACT inhaler, Inhale 2 puffs into the lungs every 6 (six) hours as needed., Disp: , Rfl:    amoxicillin-clavulanate (AUGMENTIN) 875-125 MG  tablet, Take 1 tablet by mouth 2 (two) times daily., Disp: 14 tablet, Rfl: 0   buPROPion (WELLBUTRIN XL) 300 MG 24 hr tablet, , Disp: , Rfl:    EPINEPHrine 0.3 mg/0.3 mL IJ SOAJ injection, Inject into the muscle., Disp: , Rfl:    predniSONE (DELTASONE) 20 MG tablet, Take 2 tablets (40 mg total) by mouth daily with breakfast., Disp: 8 tablet, Rfl: 0   QELBREE 200 MG 24 hr capsule, , Disp: , Rfl:   Physical exam: Exam limited due to telemedicine Physical Exam Constitutional:      General: She is not in acute distress. HENT:     Head: Normocephalic.  Pulmonary:     Effort: No respiratory distress.  Neurological:     Mental Status: She is alert and oriented to person, place, and time.  Psychiatric:        Mood and Affect: Mood normal.        Behavior: Behavior normal.       Latest Ref Rng & Units 05/03/2022    9:03 AM 12/17/2021   11:23 AM 07/04/2021    1:54 PM  CBC   WBC 4.0 - 10.5 K/uL 7.5  14.9  8.3   Hemoglobin 12.0 - 15.0 g/dL 13.0  86.5  78.4   Hematocrit 36.0 - 46.0 % 38.9  37.5  38.8   Platelets 150 - 400 K/uL 368  352  362       Latest Ref Rng & Units 03/01/2021   11:33 PM  CMP  Glucose 70 - 99 mg/dL 696   BUN 6 - 20 mg/dL 12   Creatinine 2.95 - 1.00 mg/dL 2.84   Sodium 132 - 440 mmol/L 137   Potassium 3.5 - 5.1 mmol/L 3.1   Chloride 98 - 111 mmol/L 104   CO2 22 - 32 mmol/L 25   Calcium 8.9 - 10.3 mg/dL 8.4   Total Protein 6.5 - 8.1 g/dL 7.7   Total Bilirubin 0.3 - 1.2 mg/dL 0.2   Alkaline Phos 38 - 126 U/L 77   AST 15 - 41 U/L 18   ALT 0 - 44 U/L 13    Iron/TIBC/Ferritin/ %Sat    Component Value Date/Time   IRON 129 05/03/2022 0903   TIBC 452 (H) 05/03/2022 0903   FERRITIN 68 05/03/2022 0903   IRONPCTSAT 29 05/03/2022 0903   Component Ref Range & Units 4 d ago 4 mo ago 10 mo ago  Vitamin B-12 180 - 914 pg/mL 260 212 CM 210 CM    Assessment and plan- Patient is a 22 y.o. female here for anemia follow-up   Visit Diagnosis No diagnosis found.  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 Previous EGD and colonoscopy were negative for etiology. Celiac panel and hpylori stool were negative as well. Hx of cyclical vomiting syndrome. She did not previously respond to oral iron (or oral b12) so I suspect malabsorption is contributing. She is s/p IV venofer, last 06/13/21. Labs from [facility] reviewed. Hemoglobin continues to improve, now 14. Ferritin 95, iron sat 23%. Well replenished. She is clinically asymptomatic. Plan to repeat labs in 7 months with follow up at that time. If continued stability of counts we can reduce visits to once a year.  B12 deficiency- previously no response from oral supplementation. Goal b12 level > 500. She is taking B12 injection 1000 mcg monthly. Level is now 566. Continue monthly injections. Reviewed that in setting of malabsorption, she will likely require injections  life long. Refill includings needles and syringes sent.  Menorrhagia- on OCP w/ 3 month withdrawal bleed. Improved. Tolerating well. Appreciate management from Gyn.   Disposition: 7 months (labs- cbc, cmp, ferritin, iron studies, b12) Few days to week later- see myself or Dr Smith Robert virtually for results- la  Patient expressed understanding and was in agreement with this plan. She also understands that She can call clinic at any time with any questions, concerns, or complaints.   I discussed the assessment and treatment plan with the patient. The patient was provided an opportunity to ask questions and all were answered. The patient agreed with the plan and demonstrated an understanding of the instructions.   The patient was advised to call back or seek an in-person evaluation if the symptoms worsen or if the condition fails to improve as anticipated.   I spent 15 minutes face-to-face video visit time dedicated to the care of this patient on the date of this encounter to include pre-visit review of recent and most recent past lab values, face-to-face time with the patient, and post visit ordering of testing/documentation.   Thank you for allowing me to participate in the care of this very pleasant patient.   Consuello Masse, DNP, AGNP-C, AOCNP [facility] [phone] (clinic)

## 2022-12-30 ENCOUNTER — Ambulatory Visit: Payer: 59 | Admitting: Oncology

## 2023-01-21 ENCOUNTER — Other Ambulatory Visit: Payer: Self-pay | Admitting: Student

## 2023-01-21 DIAGNOSIS — F40298 Other specified phobia: Secondary | ICD-10-CM

## 2023-01-21 DIAGNOSIS — R519 Headache, unspecified: Secondary | ICD-10-CM

## 2023-01-21 DIAGNOSIS — Z87820 Personal history of traumatic brain injury: Secondary | ICD-10-CM

## 2023-01-21 DIAGNOSIS — H53149 Visual discomfort, unspecified: Secondary | ICD-10-CM

## 2023-01-30 ENCOUNTER — Encounter: Payer: Self-pay | Admitting: Student

## 2023-02-07 ENCOUNTER — Other Ambulatory Visit: Payer: 59

## 2023-02-10 ENCOUNTER — Ambulatory Visit (INDEPENDENT_AMBULATORY_CARE_PROVIDER_SITE_OTHER): Payer: 59 | Admitting: Oncology

## 2023-02-10 VITALS — BP 120/70 | HR 107 | Temp 98.0°F | Resp 16 | Ht 67.0 in | Wt 147.0 lb

## 2023-02-10 DIAGNOSIS — M25471 Effusion, right ankle: Secondary | ICD-10-CM | POA: Diagnosis not present

## 2023-02-10 NOTE — Progress Notes (Signed)
[facility] [address] [place], [place] [address] Phone: [phone] Fax: [phone]   Office Visit Note  Patient Name: Jillian Wagner  Date of Birth:[birth year]  Med Rec number [id number]  Date of Service: 02/10/2023  Garlic and Potassium citrate  No chief complaint on file.  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  Reports she returned home from [place] yesterday and noticed the pain and swelling.  Reports it worsened throughout the night and she was woken from her sleep with discomfort.  Denies any pain, swelling or redness to her calves or behind the knee.  No history of blood clots.  Took 2 ibuprofen this morning which helped significantly but wanted to come in and have it looked at.  Had to take 2 airplane flights yesterday and drove about 45 minutes to get home.  Denies injury.  Current Medication:  Outpatient Encounter Medications as of 02/10/2023  Medication Sig   albuterol (VENTOLIN HFA) 108 (90 Base) MCG/ACT inhaler Inhale 2 puffs into the lungs every 6 (six) hours as needed.   amitriptyline (ELAVIL) 75 MG tablet Take 75 mg by mouth at bedtime.   butalbital-acetaminophen-caffeine (FIORICET) 50-325-40 MG tablet Take 1-2 tablets by mouth every 6 (six) hours as needed for headache.   cyanocobalamin (VITAMIN B12) 1000 MCG/ML injection Inject 1 mL (1,000 mcg total) into the muscle every 30 (thirty) days. Use needle and syringe to inject 1050mcg/1ml into muscle once every 4 weeks   levocetirizine (XYZAL) 5 MG tablet Take 5 mg by mouth every evening.   Norgestimate-Ethinyl Estradiol Triphasic (TRI-LO-MARZIA) 0.18/0.215/0.25 MG-25 MCG tab 1 tablet   promethazine (PHENERGAN) 12.5 MG tablet Take 12.5 mg by mouth 3 (three) times daily as needed.   SYRINGE-NEEDLE, DISP, 3 ML (BD SAFETYGLIDE SYRINGE/NEEDLE) 25G X 1" 3 ML MISC Use the needle and syringe for monthly b12 injection.   vitamin C  (ASCORBIC ACID) 500 MG tablet Take 500 mg by mouth daily.   ZOLMitriptan (ZOMIG) 2.5 MG tablet Take by mouth.   No facility-administered encounter medications on file as of 02/10/2023.      Medical History: Past Medical History:  Diagnosis Date   Anemia    Positive ANA (antinuclear antibody)      Vital Signs: BP 120/70 (BP Location: Left Arm, Patient Position: Sitting)   Pulse (!) 107   Temp 98 F (36.7 C) (Tympanic)   Resp 16   Ht 5\' 7"  (1.702 m)   Wt 147 lb (66.7 kg)   SpO2 100%   BMI 23.02 kg/m   ROS: As per HPI.  All other pertinent ROS negative.     Review of Systems  Musculoskeletal:  Positive for arthralgias (right ankle pain).    Physical Exam Musculoskeletal:     Right ankle: Swelling present. No deformity. Tenderness present over the lateral malleolus. No medial malleolus tenderness.     Comments: Effusion present to distal lateral malleolus     No results found for this or any previous visit (from the past 24 hour(s)).  Assessment/Plan: 1. Right ankle swelling -Exam is concerning for right lateral malleolus effusion.  No obvious deformity or injury per student.  No signs or symptoms for a blood clot given recent travel.  No warmth.  Discussed RICE-rest, ice, compression and elevation.  Wrapped right ankle with Medi wrap while in clinic and provided her with additional wraps if the 1 she is wearing is soiled.  Discussed  ibuprofen 2-3 tabs every 6 hours to reduce the swelling and to elevate and ice when able at home.  Please let me know if your symptoms do not improve over the next week or so.   General Counseling: arryana tolleson understanding of the findings of todays visit and agrees with plan of treatment. I have discussed any further diagnostic evaluation that may be needed or ordered today. We also reviewed her medications today. she has been encouraged to call the office with any questions or concerns that should arise related to todays visit.   No  orders of the defined types were placed in this encounter.   No orders of the defined types were placed in this encounter.   I spent 20 minutes dedicated to the care of this patient (face-to-face and non-face-to-face) on the date of the encounter to include what is described in the assessment and plan.   Durenda Hurt, NP 02/10/2023 10:54 AM

## 2023-02-21 ENCOUNTER — Ambulatory Visit
Admission: RE | Admit: 2023-02-21 | Discharge: 2023-02-21 | Disposition: A | Discharge status: Home / Self Care | Payer: 59 | Source: Ambulatory Visit | Attending: Student | Admitting: Student

## 2023-02-21 DIAGNOSIS — F40298 Other specified phobia: Secondary | ICD-10-CM

## 2023-02-21 DIAGNOSIS — R519 Headache, unspecified: Secondary | ICD-10-CM

## 2023-02-21 DIAGNOSIS — Z87820 Personal history of traumatic brain injury: Secondary | ICD-10-CM

## 2023-02-21 DIAGNOSIS — H53149 Visual discomfort, unspecified: Secondary | ICD-10-CM

## 2023-06-18 ENCOUNTER — Encounter: Payer: Self-pay | Admitting: Oncology

## 2023-06-18 NOTE — Telephone Encounter (Signed)
 She can come for cbc ferritin and iron studies and b12 next week

## 2023-06-20 ENCOUNTER — Other Ambulatory Visit: Payer: Self-pay

## 2023-06-20 DIAGNOSIS — E538 Deficiency of other specified B group vitamins: Secondary | ICD-10-CM

## 2023-06-20 DIAGNOSIS — D508 Other iron deficiency anemias: Secondary | ICD-10-CM

## 2023-06-23 ENCOUNTER — Inpatient Hospital Stay: Payer: 59

## 2023-06-27 ENCOUNTER — Inpatient Hospital Stay: Payer: 59 | Attending: Internal Medicine

## 2023-06-27 ENCOUNTER — Telehealth: Payer: Self-pay

## 2023-06-27 DIAGNOSIS — D508 Other iron deficiency anemias: Secondary | ICD-10-CM

## 2023-06-27 DIAGNOSIS — E538 Deficiency of other specified B group vitamins: Secondary | ICD-10-CM | POA: Diagnosis not present

## 2023-06-27 DIAGNOSIS — D509 Iron deficiency anemia, unspecified: Secondary | ICD-10-CM | POA: Diagnosis present

## 2023-06-27 DIAGNOSIS — N920 Excessive and frequent menstruation with regular cycle: Secondary | ICD-10-CM | POA: Diagnosis present

## 2023-06-27 LAB — IRON AND TIBC
Iron: 149 ug/dL (ref 28–170)
Saturation Ratios: 26 % (ref 10.4–31.8)
TIBC: 570 ug/dL — ABNORMAL HIGH (ref 250–450)
UIBC: 421 ug/dL

## 2023-06-27 LAB — CBC (CANCER CENTER ONLY)
HCT: 39.9 % (ref 36.0–46.0)
Hemoglobin: 13.4 g/dL (ref 12.0–15.0)
MCH: 29.6 pg (ref 26.0–34.0)
MCHC: 33.6 g/dL (ref 30.0–36.0)
MCV: 88.3 fL (ref 80.0–100.0)
Platelet Count: 377 10*3/uL (ref 150–400)
RBC: 4.52 MIL/uL (ref 3.87–5.11)
RDW: 12.8 % (ref 11.5–15.5)
WBC Count: 7.3 10*3/uL (ref 4.0–10.5)
nRBC: 0 % (ref 0.0–0.2)

## 2023-06-27 LAB — VITAMIN B12: Vitamin B-12: 422 pg/mL (ref 180–914)

## 2023-06-27 LAB — FERRITIN: Ferritin: 25 ng/mL (ref 11–307)

## 2023-06-27 NOTE — Telephone Encounter (Signed)
 Notified patient she agrees to proceed with IV Iron will await for call from scheduler.

## 2023-06-27 NOTE — Telephone Encounter (Signed)
-----   Message from Creig Hines sent at 06/27/2023  1:17 PM EST ----- Please ask her if she wants iv iron. She is not anemic but iron levels are low

## 2023-06-30 ENCOUNTER — Other Ambulatory Visit: Payer: Self-pay | Admitting: Oncology

## 2023-06-30 ENCOUNTER — Inpatient Hospital Stay: Payer: 59 | Attending: Oncology

## 2023-06-30 VITALS — BP 121/71 | HR 62 | Temp 98.2°F | Resp 16

## 2023-06-30 DIAGNOSIS — E538 Deficiency of other specified B group vitamins: Secondary | ICD-10-CM | POA: Diagnosis not present

## 2023-06-30 DIAGNOSIS — D509 Iron deficiency anemia, unspecified: Secondary | ICD-10-CM | POA: Diagnosis present

## 2023-06-30 DIAGNOSIS — D508 Other iron deficiency anemias: Secondary | ICD-10-CM

## 2023-06-30 DIAGNOSIS — N920 Excessive and frequent menstruation with regular cycle: Secondary | ICD-10-CM | POA: Insufficient documentation

## 2023-06-30 MED ORDER — SODIUM CHLORIDE 0.9% FLUSH
10.0000 mL | Freq: Once | INTRAVENOUS | Status: AC | PRN
  Administered 2023-06-30: 10 mL
  Filled 2023-06-30: qty 10

## 2023-06-30 MED ORDER — IRON SUCROSE 20 MG/ML IV SOLN
200.0000 mg | INTRAVENOUS | Status: DC
  Administered 2023-06-30: 200 mg via INTRAVENOUS

## 2023-07-02 ENCOUNTER — Inpatient Hospital Stay: Payer: 59

## 2023-07-02 VITALS — BP 124/75 | HR 62 | Temp 97.9°F | Resp 16

## 2023-07-02 DIAGNOSIS — D509 Iron deficiency anemia, unspecified: Secondary | ICD-10-CM | POA: Diagnosis not present

## 2023-07-02 DIAGNOSIS — D508 Other iron deficiency anemias: Secondary | ICD-10-CM

## 2023-07-02 MED ORDER — IRON SUCROSE 20 MG/ML IV SOLN
200.0000 mg | INTRAVENOUS | Status: DC
  Administered 2023-07-02: 200 mg via INTRAVENOUS
  Filled 2023-07-02: qty 10

## 2023-07-02 MED ORDER — SODIUM CHLORIDE 0.9% FLUSH
10.0000 mL | Freq: Once | INTRAVENOUS | Status: AC | PRN
  Administered 2023-07-02: 10 mL
  Filled 2023-07-02: qty 10

## 2023-07-02 NOTE — Patient Instructions (Signed)

## 2023-07-07 ENCOUNTER — Inpatient Hospital Stay: Payer: 59

## 2023-07-07 VITALS — BP 132/79 | HR 88 | Resp 16

## 2023-07-07 DIAGNOSIS — D509 Iron deficiency anemia, unspecified: Secondary | ICD-10-CM | POA: Diagnosis not present

## 2023-07-07 DIAGNOSIS — D508 Other iron deficiency anemias: Secondary | ICD-10-CM

## 2023-07-07 MED ORDER — IRON SUCROSE 20 MG/ML IV SOLN
200.0000 mg | INTRAVENOUS | Status: DC
  Administered 2023-07-07: 200 mg via INTRAVENOUS
  Filled 2023-07-07: qty 10

## 2023-07-07 NOTE — Patient Instructions (Signed)

## 2023-07-09 ENCOUNTER — Inpatient Hospital Stay

## 2023-07-09 ENCOUNTER — Inpatient Hospital Stay: Payer: 59

## 2023-07-09 VITALS — BP 140/99 | HR 98 | Temp 97.9°F | Resp 16

## 2023-07-09 DIAGNOSIS — D508 Other iron deficiency anemias: Secondary | ICD-10-CM

## 2023-07-09 DIAGNOSIS — D509 Iron deficiency anemia, unspecified: Secondary | ICD-10-CM | POA: Diagnosis not present

## 2023-07-09 MED ORDER — IRON SUCROSE 20 MG/ML IV SOLN
200.0000 mg | INTRAVENOUS | Status: DC
  Administered 2023-07-09: 200 mg via INTRAVENOUS
  Filled 2023-07-09: qty 10

## 2023-07-11 ENCOUNTER — Inpatient Hospital Stay: Payer: 59

## 2023-07-11 VITALS — BP 120/81 | HR 89 | Temp 98.6°F | Resp 18

## 2023-07-11 DIAGNOSIS — D509 Iron deficiency anemia, unspecified: Secondary | ICD-10-CM | POA: Diagnosis not present

## 2023-07-11 DIAGNOSIS — D508 Other iron deficiency anemias: Secondary | ICD-10-CM

## 2023-07-11 MED ORDER — SODIUM CHLORIDE 0.9% FLUSH
10.0000 mL | Freq: Once | INTRAVENOUS | Status: AC | PRN
  Administered 2023-07-11: 10 mL
  Filled 2023-07-11: qty 10

## 2023-07-11 MED ORDER — IRON SUCROSE 20 MG/ML IV SOLN
200.0000 mg | INTRAVENOUS | Status: DC
  Administered 2023-07-11: 200 mg via INTRAVENOUS
  Filled 2023-07-11: qty 10

## 2023-07-25 ENCOUNTER — Other Ambulatory Visit: Payer: Self-pay

## 2023-07-25 DIAGNOSIS — D508 Other iron deficiency anemias: Secondary | ICD-10-CM

## 2023-07-28 ENCOUNTER — Other Ambulatory Visit: Payer: 59

## 2023-07-28 ENCOUNTER — Inpatient Hospital Stay

## 2023-07-28 DIAGNOSIS — D509 Iron deficiency anemia, unspecified: Secondary | ICD-10-CM | POA: Diagnosis not present

## 2023-07-28 DIAGNOSIS — D508 Other iron deficiency anemias: Secondary | ICD-10-CM

## 2023-07-28 LAB — CMP (CANCER CENTER ONLY)
ALT: 22 U/L (ref 0–44)
AST: 23 U/L (ref 15–41)
Albumin: 4.4 g/dL (ref 3.5–5.0)
Alkaline Phosphatase: 58 U/L (ref 38–126)
Anion gap: 9 (ref 5–15)
BUN: 17 mg/dL (ref 6–20)
CO2: 22 mmol/L (ref 22–32)
Calcium: 9.3 mg/dL (ref 8.9–10.3)
Chloride: 107 mmol/L (ref 98–111)
Creatinine: 0.84 mg/dL (ref 0.44–1.00)
GFR, Estimated: >60 mL/min (ref >60)
Glucose, Bld: 92 mg/dL (ref 70–99)
Potassium: 4.3 mmol/L (ref 3.5–5.1)
Sodium: 138 mmol/L (ref 135–145)
Total Bilirubin: 0.4 mg/dL (ref 0.0–1.2)
Total Protein: 8.1 g/dL (ref 6.5–8.1)

## 2023-07-28 LAB — CBC WITH DIFFERENTIAL (CANCER CENTER ONLY)
Abs Immature Granulocytes: 0.02 10*3/uL (ref 0.00–0.07)
Basophils Absolute: 0.1 10*3/uL (ref 0.0–0.1)
Basophils Relative: 1 %
Eosinophils Absolute: 0.2 10*3/uL (ref 0.0–0.5)
Eosinophils Relative: 2 %
HCT: 42 % (ref 36.0–46.0)
Hemoglobin: 14.2 g/dL (ref 12.0–15.0)
Immature Granulocytes: 0 %
Lymphocytes Relative: 37 %
Lymphs Abs: 3.4 10*3/uL (ref 0.7–4.0)
MCH: 29.6 pg (ref 26.0–34.0)
MCHC: 33.8 g/dL (ref 30.0–36.0)
MCV: 87.7 fL (ref 80.0–100.0)
Monocytes Absolute: 0.6 10*3/uL (ref 0.1–1.0)
Monocytes Relative: 6 %
Neutro Abs: 5 10*3/uL (ref 1.7–7.7)
Neutrophils Relative %: 54 %
Platelet Count: 410 10*3/uL — ABNORMAL HIGH (ref 150–400)
RBC: 4.79 MIL/uL (ref 3.87–5.11)
RDW: 12.6 % (ref 11.5–15.5)
WBC Count: 9.3 10*3/uL (ref 4.0–10.5)
nRBC: 0 % (ref 0.0–0.2)

## 2023-07-28 LAB — FERRITIN: Ferritin: 322 ng/mL — ABNORMAL HIGH (ref 11–307)

## 2023-07-28 LAB — IRON AND TIBC
Iron: 112 ug/dL (ref 28–170)
Saturation Ratios: 24 % (ref 10.4–31.8)
TIBC: 466 ug/dL — ABNORMAL HIGH (ref 250–450)
UIBC: 354 ug/dL

## 2023-07-28 LAB — VITAMIN B12: Vitamin B-12: 306 pg/mL (ref 180–914)

## 2023-08-01 ENCOUNTER — Inpatient Hospital Stay: Payer: 59 | Attending: Oncology | Admitting: Oncology

## 2023-08-01 ENCOUNTER — Encounter: Payer: Self-pay | Admitting: Oncology

## 2023-08-01 DIAGNOSIS — D509 Iron deficiency anemia, unspecified: Secondary | ICD-10-CM

## 2023-08-01 NOTE — Progress Notes (Signed)
 Patient verified using two identifiers for virtual visit via telephone today.  Patient denies new or acute problems/concerns today.

## 2023-08-02 ENCOUNTER — Encounter: Payer: Self-pay | Admitting: Oncology

## 2023-08-02 NOTE — Progress Notes (Signed)
 I connected with Jillian Wagner on 08/02/23 at  3:15 PM EDT by video enabled telemedicine visit and verified that I am speaking with the correct person using two identifiers.   I discussed the limitations, risks, security and privacy concerns of performing an evaluation and management service by telemedicine and the availability of in-person appointments. I also discussed with the patient that there may be a patient responsible charge related to this service. The patient expressed understanding and agreed to proceed.  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  She has received IV iron in the past.  No history of GI bleed and she has had EGD and colonoscopy as well in the past  Interval history patient received IV iron in March 2025 and presently reports significant improvement in her fatigue   Review of Systems  Constitutional:  Negative for chills, fever, malaise/fatigue and weight loss.  HENT:  Negative for congestion, ear discharge and nosebleeds.   Eyes:  Negative for blurred vision.  Respiratory:  Negative for cough, hemoptysis, sputum production, shortness of breath and wheezing.   Cardiovascular:  Negative for chest pain, palpitations, orthopnea and claudication.  Gastrointestinal:  Negative for abdominal pain, blood in stool, constipation, diarrhea, heartburn, melena, nausea and vomiting.  Genitourinary:  Negative for dysuria, flank pain, frequency, hematuria and urgency.  Musculoskeletal:  Negative for back pain, joint pain and myalgias.  Skin:  Negative for rash.  Neurological:  Negative for dizziness, tingling, focal weakness, seizures, weakness and headaches.   Endo/Heme/Allergies:  Does not bruise/bleed easily.  Psychiatric/Behavioral:  Negative for depression and suicidal ideas. The patient does not have insomnia.     Allergies  Allergen Reactions   Garlic Nausea And Vomiting   Potassium Citrate Nausea And Vomiting    Past Medical History:  Diagnosis Date   Anemia    Positive ANA (antinuclear antibody)     History reviewed. No pertinent surgical history.  Social History   Socioeconomic History   Marital status: Single    Spouse name: Not on file   Number of children: Not on file   Years of education: Not on file   Highest education level: Not on file  Occupational History   Not on file  Tobacco Use   Smoking status: Never    Passive exposure: Never   Smokeless tobacco: Never  Vaping Use   Vaping status: Never Used  Substance and Sexual Activity   Alcohol use: Not Currently   Drug use: Never   Sexual activity: Not Currently  Other Topics Concern   Not on file  Social History Narrative   Not on file   Social Drivers of Health   Financial Resource Strain: Not on file  Food Insecurity: Not on file  Transportation Needs: Unknown (07/08/2019)   [facility], [facility]   Transportation Needs    Lack reliable transportation: Not on file  Physical Activity: Not on file  Stress: Not on file  Social Connections: Unknown (07/08/2019)   Received [facility], [facility]   Social Connections    Do your friends and family support you?: Not on file    What agencies support you?: Not  on file  Intimate Partner Violence: Not on file    Family History  Problem Relation Age of Onset   Raynaud syndrome Mother    Other Mother        epitaxis   Asthma Father    Other Sister        GERD   Psoriasis Maternal Grandmother    Celiac disease Maternal Grandmother    Ulcerative colitis Paternal Grandmother    Arthritis-Osteo Paternal Grandfather       Current Outpatient Medications:    albuterol (VENTOLIN HFA) 108 (90 Base) MCG/ACT inhaler, Inhale 2 puffs into the lungs every 6 (six) hours as needed., Disp: , Rfl:    AZSTARYS 39.2-7.8 MG CAPS, Take 1 capsule by mouth every morning., Disp: , Rfl:    cyanocobalamin (VITAMIN B12) 1000 MCG/ML injection, Inject 1 mL (1,000 mcg total) into the muscle every 30 (thirty) days. Use needle and syringe to inject 10110mcg/1ml into muscle once every 4 weeks, Disp: 6 mL, Rfl: 1   levocetirizine (XYZAL) 5 MG tablet, Take 5 mg by mouth every evening., Disp: , Rfl:    magnesium oxide (MAG-OX) 400 MG tablet, Take 400 mg by mouth daily., Disp: , Rfl:    Norgestimate-Ethinyl Estradiol Triphasic (TRI-LO-MARZIA) 0.18/0.215/0.25 MG-25 MCG tab, 1 tablet, Disp: , Rfl:    promethazine (PHENERGAN) 12.5 MG tablet, Take 12.5 mg by mouth 3 (three) times daily as needed., Disp: , Rfl:    SYRINGE-NEEDLE, DISP, 3 ML (BD SAFETYGLIDE SYRINGE/NEEDLE) 25G X 1" 3 ML MISC, Use the needle and syringe for monthly b12 injection., Disp: 6 each, Rfl: 1   Venlafaxine HCl 37.5 MG TB24, Take 37.5 mg by mouth in the morning and at bedtime., Disp: , Rfl:    ZOLMitriptan (ZOMIG) 2.5 MG tablet, Take 2.5 mg by mouth as needed for migraine., Disp: , Rfl:   No results found.  No images are attached to the encounter.      Latest Ref Rng & Units 07/28/2023    2:42 PM  CMP  Glucose 70 - 99 mg/dL 92   BUN 6 - 20 mg/dL 17   Creatinine 1.61 - 1.00 mg/dL 0.96   Sodium 045 - 409 mmol/L 138   Potassium 3.5 - 5.1 mmol/L 4.3   Chloride 98 - 111 mmol/L 107   CO2 22 - 32 mmol/L 22   Calcium 8.9 - 10.3 mg/dL 9.3   Total Protein 6.5 - 8.1 g/dL 8.1   Total Bilirubin 0.0 - 1.2 mg/dL 0.4   Alkaline Phos 38 - 126 U/L 58   AST 15 - 41 U/L 23   ALT 0 - 44 U/L 22       Latest Ref Rng & Units 07/28/2023    2:42 PM  CBC  WBC 4.0 - 10.5 K/uL 9.3   Hemoglobin 12.0 - 15.0 g/dL 81.1   Hematocrit 91.4 - 46.0 % 42.0   Platelets 150 - 400 K/uL 410       Observation/objective: Appears in no acute distress over video visit today.  Breathing is nonlabored  Assessment and plan: Patient is a 23 year old female here for routine follow-up of iron Deficiency anemia  Although patient was not anemic back in February 2025 her ferritin levels were down to 25 with elevated TIBC and patient was symptomatic at that time.  She received IV iron and today her ferritin levels have normalized at 322.  Hemoglobin has also mildly improved to 13.4.  She does not require any IV iron at this time.  CBC ferritin and iron studies in 6 months in 1 year and I will see her back in 1 year  Follow-up instructions:as above  I discussed the assessment and treatment plan with the patient. The patient was provided an opportunity to ask questions and all were answered. The patient agreed with the plan and demonstrated an understanding of the instructions.   The patient was advised to call back or seek an in-person evaluation if the symptoms worsen or if the condition fails to improve as anticipated.  I provided 11 minutes of face-to-face video visit time during this encounter, and > 50% was spent in coordination of care Visit Diagnosis: 1. Iron deficiency anemia, unspecified iron deficiency anemia type     Dr. Owens Shark, MD, MPH [facility] at [facility] Tel- [phone] 08/02/2023 12:38 PM

## 2023-08-04 ENCOUNTER — Ambulatory Visit (INDEPENDENT_AMBULATORY_CARE_PROVIDER_SITE_OTHER): Admitting: Physician Assistant

## 2023-08-04 ENCOUNTER — Other Ambulatory Visit: Payer: Self-pay

## 2023-08-04 ENCOUNTER — Encounter: Payer: Self-pay | Admitting: Physician Assistant

## 2023-08-04 VITALS — BP 108/75 | HR 119 | Temp 98.5°F

## 2023-08-04 DIAGNOSIS — J029 Acute pharyngitis, unspecified: Secondary | ICD-10-CM | POA: Diagnosis not present

## 2023-08-04 DIAGNOSIS — J22 Unspecified acute lower respiratory infection: Secondary | ICD-10-CM

## 2023-08-04 LAB — POCT RAPID STREP A (OFFICE): Rapid Strep A Screen: NEGATIVE

## 2023-08-04 MED ORDER — AZITHROMYCIN 250 MG PO TABS: ORAL_TABLET | ORAL | 0 refills | Status: AC

## 2023-08-04 NOTE — Progress Notes (Signed)
 [facility] [address] [place], [place] [address] Phone: [phone] Fax: [phone]   Office Visit Note  Patient Name: Jillian Wagner  Date of Birth:[birth year]  Med Rec number [id number]   Garlic and Potassium citrate  Chief Complaint  Patient presents with   Acute Visit   23 yo female here for a sick visit  Late Thursday night - started to have sore throat, congestion (rhinorrhea is clear), ear fullness, cough, headache and not migraines, joint pain  Cough is productive- darker yellow  Feels like mucus still in chest that cannot get out  Thought it was allergies but this is different   Saturday, did flu and COVID tests - negative  Yesterday, temperature 101.53F  Taking Xyzal twice a day and Flonase for her allergies   Never had mono before - interested in testing if still feeling poorly after abx  Current Medication:  Outpatient Encounter Medications as of 08/04/2023  Medication Sig   azithromycin (ZITHROMAX) 250 MG tablet Take 2 tablets on day 1, then 1 tablet daily on days 2 through 5   albuterol (VENTOLIN HFA) 108 (90 Base) MCG/ACT inhaler Inhale 2 puffs into the lungs every 6 (six) hours as needed.   AZSTARYS 39.2-7.8 MG CAPS Take 1 capsule by mouth every morning.   cyanocobalamin (VITAMIN B12) 1000 MCG/ML injection Inject 1 mL (1,000 mcg total) into the muscle every 30 (thirty) days. Use needle and syringe to inject 1074mcg/1ml into muscle once every 4 weeks   levocetirizine (XYZAL) 5 MG tablet Take 5 mg by mouth every evening.   magnesium oxide (MAG-OX) 400 MG tablet Take 400 mg by mouth daily.   Norgestimate-Ethinyl Estradiol Triphasic (TRI-LO-MARZIA) 0.18/0.215/0.25 MG-25 MCG tab 1 tablet   promethazine (PHENERGAN) 12.5 MG tablet Take 12.5 mg by mouth 3 (three) times daily as needed.   Venlafaxine HCl 37.5 MG TB24 Take 37.5 mg by mouth in the morning and at bedtime.   ZOLMitriptan (ZOMIG) 2.5 MG tablet Take 2.5 mg by mouth as needed for migraine.    [DISCONTINUED] SYRINGE-NEEDLE, DISP, 3 ML (BD SAFETYGLIDE SYRINGE/NEEDLE) 25G X 1" 3 ML MISC Use the needle and syringe for monthly b12 injection. (Patient not taking: Reported on 08/04/2023)   No facility-administered encounter medications on file as of 08/04/2023.      Medical History: Past Medical History:  Diagnosis Date   Anemia    Positive ANA (antinuclear antibody)      Vital Signs: BP 108/75   Pulse (!) 119   Temp 98.5 F (36.9 C) (Tympanic)   SpO2 98%    ROS negative unless otherwise indicated above.  Physical Exam Vitals reviewed.  HENT:     Right Ear: Ear canal and external ear normal. No laceration, drainage, swelling or tenderness. There is no impacted cerumen. No foreign body. Tympanic membrane is not injected, perforated or erythematous.     Left Ear: Ear canal and external ear normal. No laceration, drainage, swelling or tenderness. There is no impacted cerumen. No foreign body. Tympanic membrane is not injected, perforated or erythematous.     Nose: Congestion and rhinorrhea present. No mucosal edema.     Right Nostril: No epistaxis.     Left Nostril: No epistaxis.     Right Turbinates: Swollen. Not enlarged.     Left Turbinates: Swollen. Not enlarged.     Right Sinus: No maxillary sinus tenderness or frontal sinus tenderness.     Left Sinus: Maxillary sinus tenderness present. No frontal sinus tenderness.  Mouth/Throat:     Pharynx: Posterior oropharyngeal erythema present. No oropharyngeal exudate.     Tonsils: No tonsillar exudate or tonsillar abscesses. 2+ on the right. 2+ on the left.  Eyes:     Pupils: Pupils are equal, round, and reactive to light.  Cardiovascular:     Rate and Rhythm: Regular rhythm. Tachycardia present.     Heart sounds: Normal heart sounds. No murmur heard.    No friction rub.  Pulmonary:     Effort: Pulmonary effort is normal. No respiratory distress.     Breath sounds: No stridor. No wheezing, rhonchi or rales.      Comments: Crackles of LLL Musculoskeletal:     Cervical back: No tenderness.  Lymphadenopathy:     Cervical: Cervical adenopathy present.  Neurological:     Mental Status: She is alert.  Psychiatric:        Behavior: Behavior normal.     Results for orders placed or performed in visit on 08/04/23 (from the past 24 hours)  POCT rapid strep A     Status: Normal   Collection Time: 08/04/23 10:45 AM  Result Value Ref Range   Rapid Strep A Screen Negative Negative     Assessment/Plan:  1. Sore throat - POCT rapid strep A  2. Lower respiratory infection (Primary) - azithromycin (ZITHROMAX) 250 MG tablet; Take 2 tablets on day 1, then 1 tablet daily on days 2 through 5  Dispense: 6 tablet; Refill: 0   Strep negative. Exam is most concerning for lung sounds. Starting a Zpack. Avoiding amoxicillin as have not yet tested for mono - she will send a message if still feeling poorly after her antibiotic - we will collect a CBC/EBV at that time if needed.  Recommended she continue her antihistamine and nasal spray for her allergies.  In addition, recommendation was for Nettie pot and keeping her sinuses as clear as possible.  Sudafed and Mucinex were also discussed. Stressed that I want to keep everything in her sinuses moving.  Finish all antibiotics as prescribed, even when you are feeling better. Take antibiotic with food. Take an over-the-counter pain reliever/anti-inflammatory (such as ibuprofen) to help relieve pain or fever. Rest and drink plenty of water. Drinking warm or cold liquids (such as tea, soup or smoothies) and eating soft foods (such as oatmeal) may be more comfortable until your throat pain improves. Do not share cups/water bottles/ utensils with others. Wash your hands or use hand sanitizer often, especially before/after eating and after coughing into your hand or blowing your nose. Send secure message to provider or schedule return appointment as needed for new/worsening  symptoms (such as increased throat pain) or if your symptoms are not improving after 2-3 days on the antibiotic.    General Counseling: larin depaoli understanding of the findings of todays visit and agrees with plan of treatment.   she has been encouraged to call the office with any questions or concerns that should arise related to todays visit.  Total time spent with patient today 20 minutes. This includes reviewing records, evaluating the patient, and coordinating care. Face-to-face time >50%.    Orders Placed This Encounter  Procedures   POCT rapid strep A    Meds ordered this encounter  Medications   azithromycin (ZITHROMAX) 250 MG tablet    Sig: Take 2 tablets on day 1, then 1 tablet daily on days 2 through 5    Dispense:  6 tablet    Refill:  0  Supervising Provider:   Erasmo Downer [[id number]]      SignedLennon Alstrom, PA-C 08/04/2023, 10:45 AM

## 2023-11-03 IMAGING — CR DG CHEST 2V
1 series · 2 of 2 positions shown · non-contrast
Comparison: None.

CLINICAL DATA: Shortness of breath.

EXAM:
CHEST - 2 VIEW

[Series 1: dg chest 2 view · 0.14mm/px · 2 of 2 slices shown]
[im 1/2]
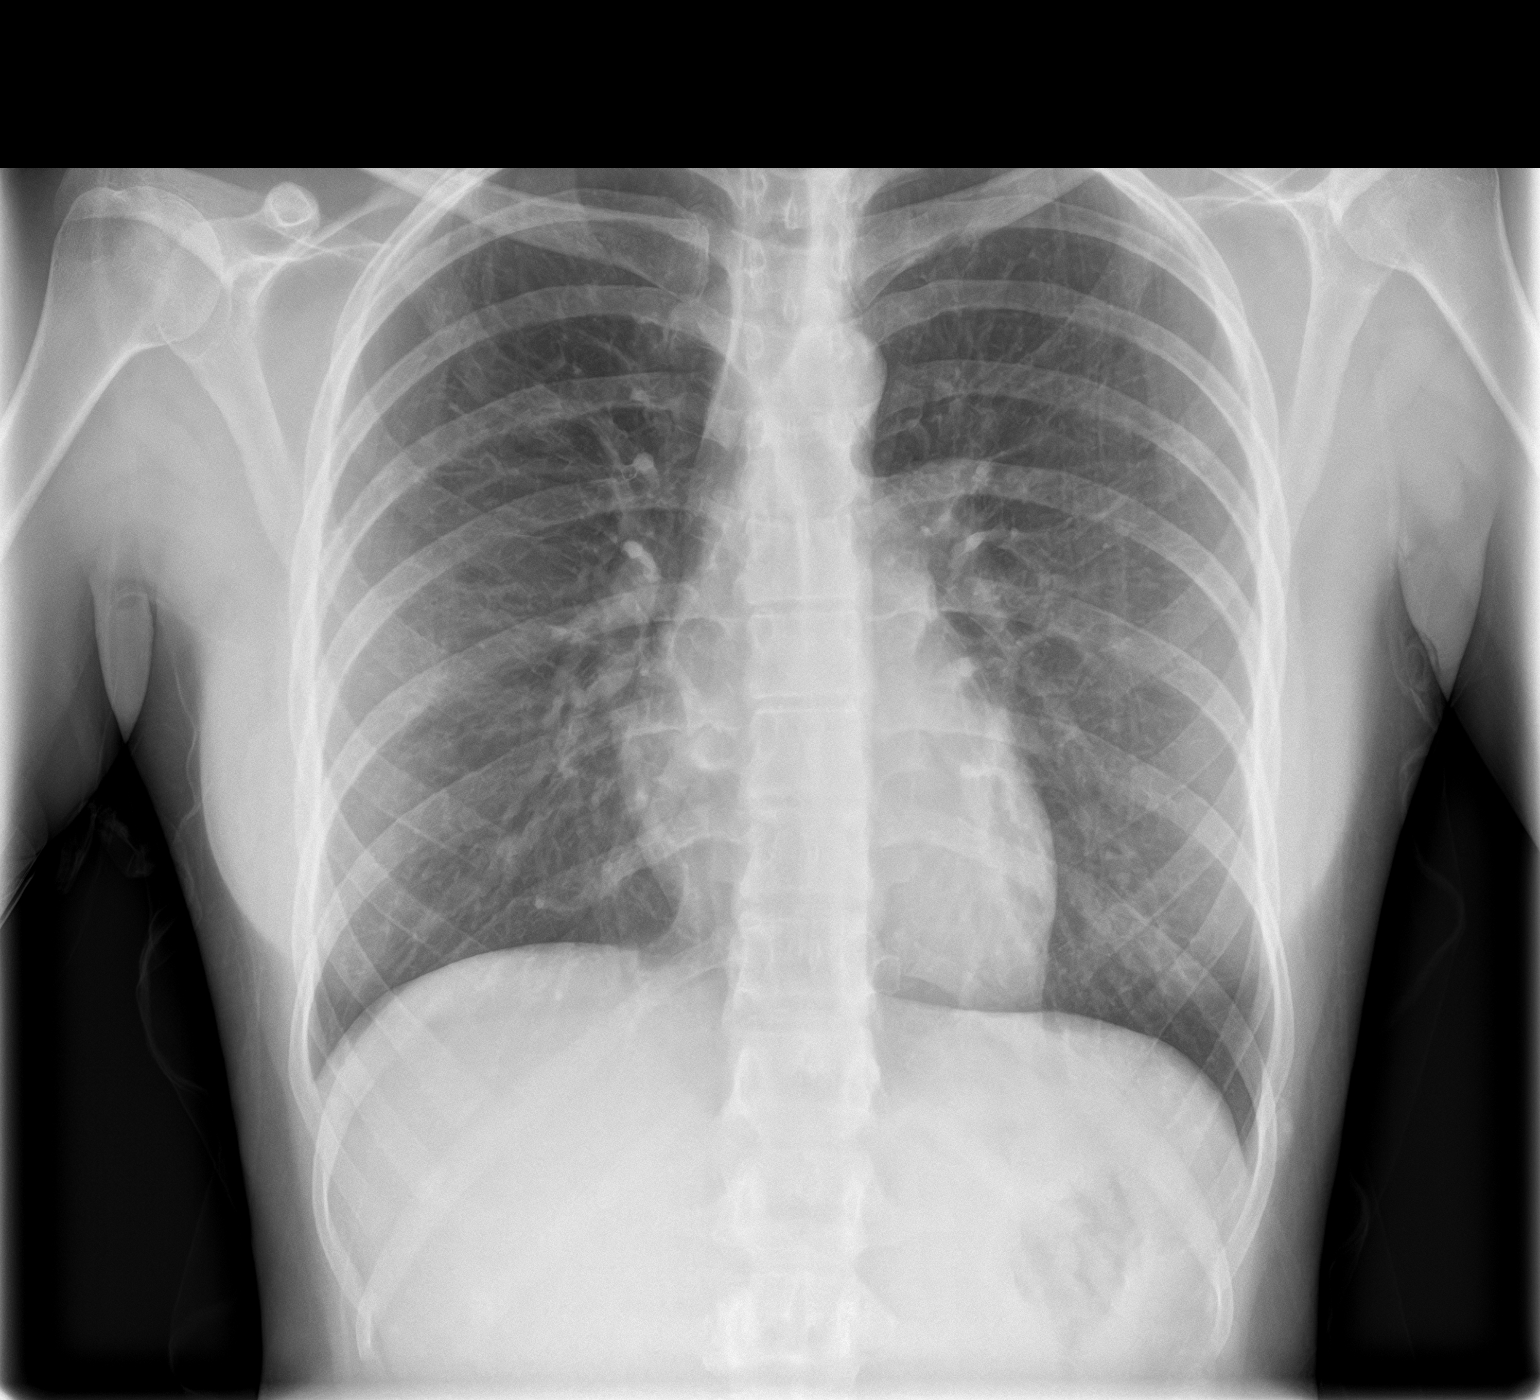
[im 2/2]
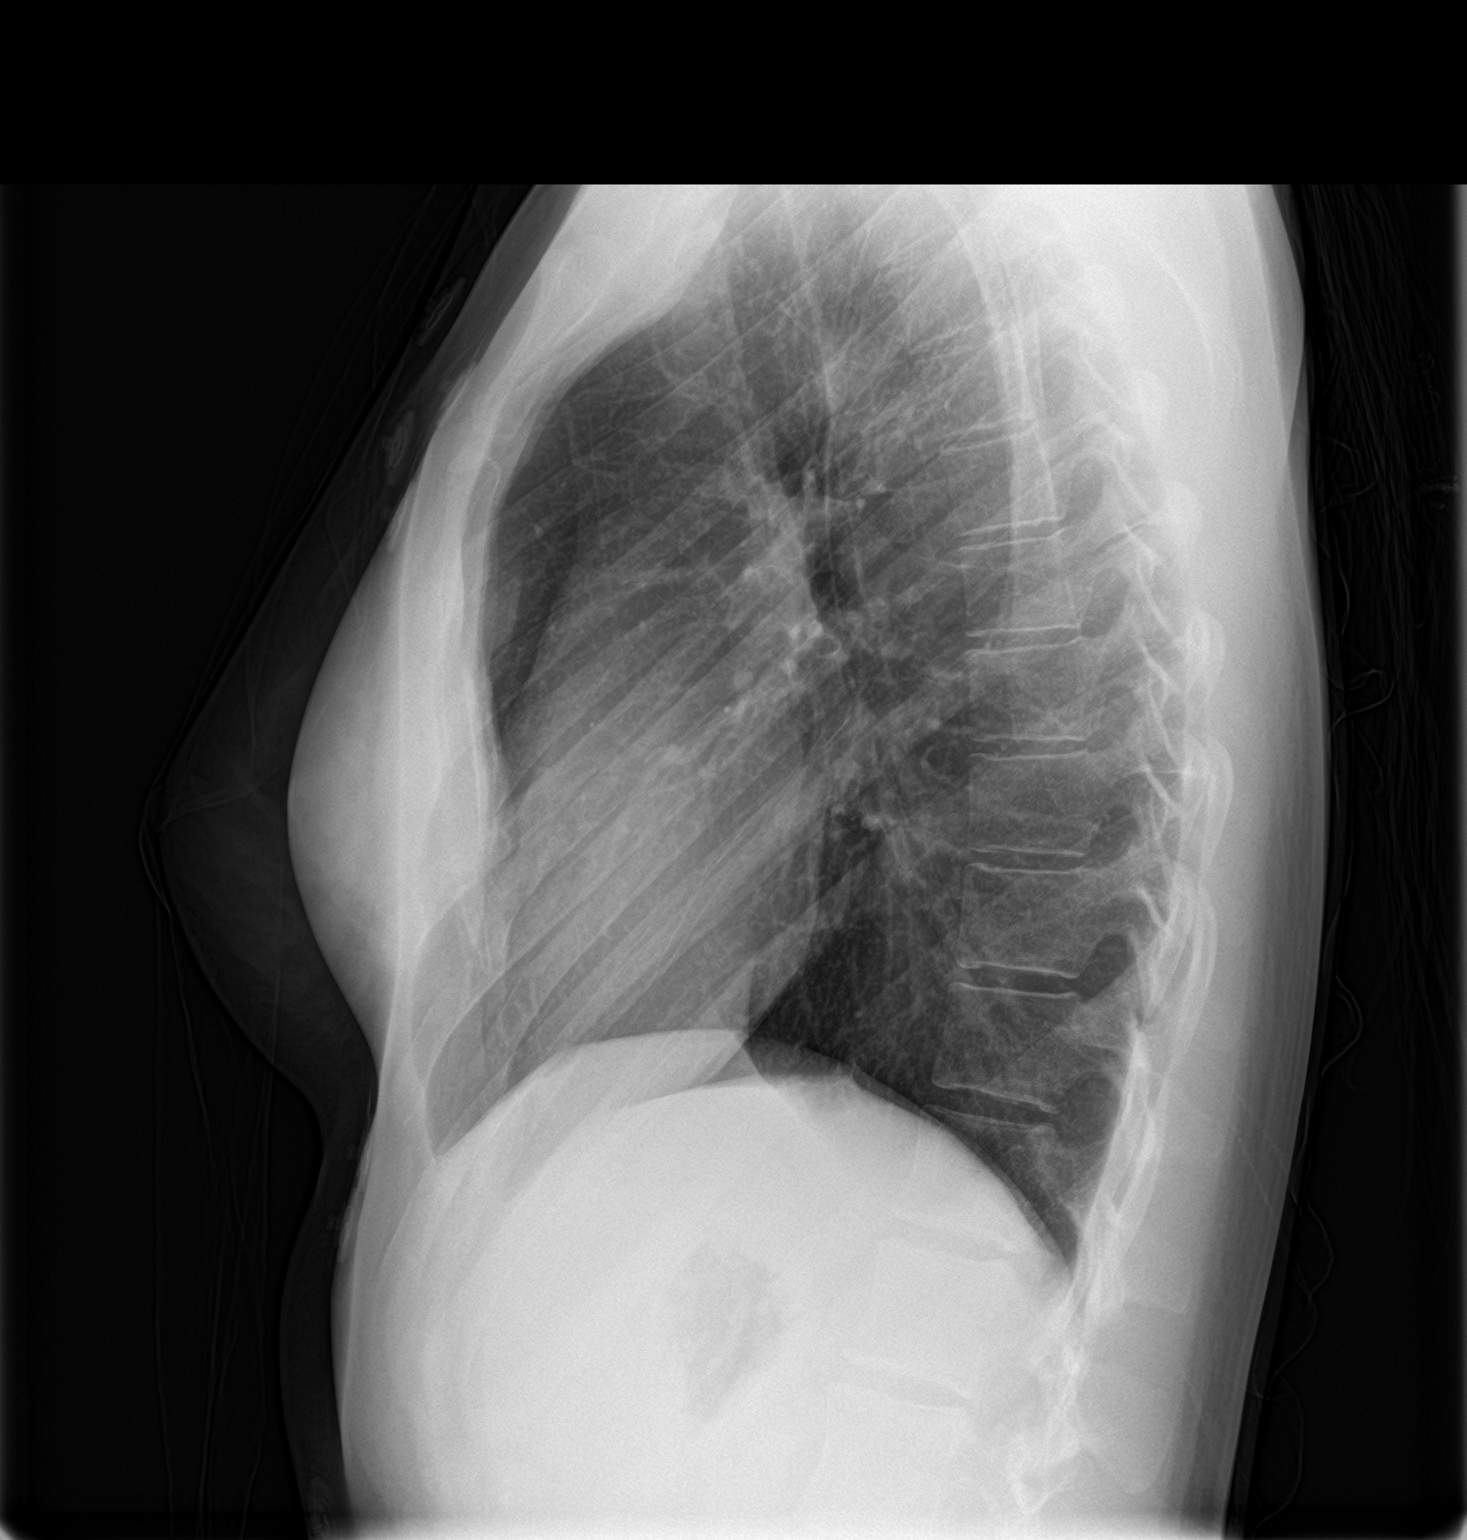

[2 of 2 positions shown; findings below may reference images not displayed]

FINDINGS: The heart size and mediastinal contours are within normal limits.
Both lungs are clear. The visualized skeletal structures are
unremarkable.
IMPRESSION: No active cardiopulmonary disease.

## 2023-12-19 ENCOUNTER — Telehealth: Payer: Self-pay

## 2023-12-19 NOTE — Telephone Encounter (Addendum)
 Per secure chat this patient called and needs an order for her labs to [facility] in [place] . The fax is [phone]. She would like a call back once it is complete so she can go and have them done..  Per OV note dated 08/01/23 She does not require any IV iron  at this time. CBC ferritin and iron  studies in 6 months in 1 year and I will see her back in 1 year. Outbound call to patient to clarify about when lab orders are needed; detailed voice message left.

## 2023-12-25 ENCOUNTER — Encounter: Payer: Self-pay | Admitting: Oncology

## 2023-12-25 NOTE — Telephone Encounter (Signed)
 Another voice message left.

## 2024-01-15 ENCOUNTER — Telehealth: Payer: Self-pay | Admitting: Oncology

## 2024-01-15 NOTE — Telephone Encounter (Signed)
 Pt called and requested to move lab appt up a week - changed appt w/pt - LH

## 2024-01-17 ENCOUNTER — Other Ambulatory Visit: Payer: Self-pay | Admitting: Nurse Practitioner

## 2024-01-21 ENCOUNTER — Encounter: Payer: Self-pay | Admitting: Oncology

## 2024-01-22 ENCOUNTER — Other Ambulatory Visit: Payer: Self-pay

## 2024-01-22 DIAGNOSIS — E538 Deficiency of other specified B group vitamins: Secondary | ICD-10-CM

## 2024-01-22 DIAGNOSIS — D508 Other iron deficiency anemias: Secondary | ICD-10-CM

## 2024-01-23 ENCOUNTER — Inpatient Hospital Stay

## 2024-01-28 ENCOUNTER — Inpatient Hospital Stay: Attending: Oncology

## 2024-01-28 DIAGNOSIS — D509 Iron deficiency anemia, unspecified: Secondary | ICD-10-CM | POA: Insufficient documentation

## 2024-01-28 DIAGNOSIS — D508 Other iron deficiency anemias: Secondary | ICD-10-CM

## 2024-01-28 DIAGNOSIS — Z79899 Other long term (current) drug therapy: Secondary | ICD-10-CM | POA: Insufficient documentation

## 2024-01-28 DIAGNOSIS — E538 Deficiency of other specified B group vitamins: Secondary | ICD-10-CM

## 2024-01-28 LAB — CBC (CANCER CENTER ONLY)
HCT: 37.7 % (ref 36.0–46.0)
Hemoglobin: 13.4 g/dL (ref 12.0–15.0)
MCH: 30.6 pg (ref 26.0–34.0)
MCHC: 35.5 g/dL (ref 30.0–36.0)
MCV: 86.1 fL (ref 80.0–100.0)
Platelet Count: 399 K/uL (ref 150–400)
RBC: 4.38 MIL/uL (ref 3.87–5.11)
RDW: 11.9 % (ref 11.5–15.5)
WBC Count: 9.6 K/uL (ref 4.0–10.5)
nRBC: 0 % (ref 0.0–0.2)

## 2024-01-28 LAB — IRON AND TIBC
Iron: 46 ug/dL (ref 28–170)
Saturation Ratios: 13 % (ref 10.4–31.8)
TIBC: 367 ug/dL (ref 250–450)
UIBC: 321 ug/dL

## 2024-01-28 LAB — FERRITIN: Ferritin: 237 ng/mL (ref 11–307)

## 2024-01-28 LAB — VITAMIN B12: Vitamin B-12: 444 pg/mL (ref 180–914)

## 2024-01-30 ENCOUNTER — Other Ambulatory Visit

## 2024-02-03 ENCOUNTER — Encounter: Payer: Self-pay | Admitting: Oncology

## 2024-02-03 DIAGNOSIS — D508 Other iron deficiency anemias: Secondary | ICD-10-CM

## 2024-02-04 NOTE — Telephone Encounter (Signed)
 No need for IV iron  at this time.  I did respond to the patient's query.  We can have her come back for CBC ferritin and iron  studies sometime in January 2026

## 2024-02-05 NOTE — Telephone Encounter (Signed)
 Per Dr. Melanee No need for IV iron  at this time.  I did respond to the patient's query.  We can have her come back for CBC ferritin and iron  studies sometime in January 2026.  Orders placed in system; routing to scheduling for lab appointment only in January.

## 2024-03-13 ENCOUNTER — Ambulatory Visit: Admitting: Medical

## 2024-03-13 ENCOUNTER — Encounter: Payer: Self-pay | Admitting: Medical

## 2024-03-13 VITALS — BP 112/76 | HR 81 | Temp 98.3°F | Ht 67.0 in | Wt 138.0 lb

## 2024-03-13 DIAGNOSIS — R5383 Other fatigue: Secondary | ICD-10-CM

## 2024-03-13 DIAGNOSIS — Z23 Encounter for immunization: Secondary | ICD-10-CM | POA: Diagnosis not present

## 2024-03-13 DIAGNOSIS — Z77120 Contact with and (suspected) exposure to mold (toxic): Secondary | ICD-10-CM | POA: Diagnosis not present

## 2024-03-13 DIAGNOSIS — J309 Allergic rhinitis, unspecified: Secondary | ICD-10-CM

## 2024-03-13 DIAGNOSIS — H109 Unspecified conjunctivitis: Secondary | ICD-10-CM | POA: Diagnosis not present

## 2024-03-13 MED ORDER — ALBUTEROL SULFATE HFA 108 (90 BASE) MCG/ACT IN AERS: 1.0000 | INHALATION_SPRAY | RESPIRATORY_TRACT | 0 refills | Status: AC | PRN

## 2024-03-13 NOTE — Progress Notes (Unsigned)
 [facility] [address] [place], [place] [address] Phone: [phone] Fax: [phone]   Office Visit Note  Patient Name: Jillian Wagner  Date of Birth:[birth year]  Med Rec number [id number]  Date of Service: 03/13/2024  Allergies: Garlic and Potassium citrate  Chief Complaint  Patient presents with   Headache   Brain Fog   Sinusitis   Exposure to mold   Immunizations    Tdap for travel out of the country     Headache   Sinusitis Associated symptoms include headaches.      Current Medication:  Outpatient Encounter Medications as of 03/13/2024  Medication Sig   albuterol (VENTOLIN HFA) 108 (90 Base) MCG/ACT inhaler Inhale 2 puffs into the lungs every 6 (six) hours as needed.   AZSTARYS 39.2-7.8 MG CAPS Take 1 capsule by mouth every morning.   cyanocobalamin  (VITAMIN B12) 1000 MCG/ML injection INJECT 1 ML INTO THE MUSCLE EVERY 30 DAYS. USE NEEDLE AND SYRINGE TO INJECT 1000MCG/1ML INTO MUSCLE ONCE EVERY 4 WEEKS   levocetirizine (XYZAL) 5 MG tablet Take 5 mg by mouth every evening.   magnesium oxide (MAG-OX) 400 MG tablet Take 400 mg by mouth daily.   Norgestimate-Ethinyl Estradiol Triphasic (TRI-LO-MARZIA) 0.18/0.215/0.25 MG-25 MCG tab 1 tablet   promethazine (PHENERGAN) 12.5 MG tablet Take 12.5 mg by mouth 3 (three) times daily as needed.   Venlafaxine HCl 37.5 MG TB24 Take 37.5 mg by mouth in the morning and at bedtime.   ZOLMitriptan (ZOMIG) 2.5 MG tablet Take 2.5 mg by mouth as needed for migraine.   No facility-administered encounter medications on file as of 03/13/2024.      Medical History: Past Medical History:  Diagnosis Date   Anemia    Positive ANA (antinuclear antibody)      Vital Signs: BP 112/76   Pulse 81   Temp 98.3 F (36.8 C)   Ht 5' 7 (1.702 m)   Wt 138 lb (62.6 kg)   SpO2 99%   BMI 21.61 kg/m    Review of Systems  Neurological:  Positive for headaches.    Physical Exam Vitals reviewed.  Constitutional:       General: She is not in acute distress.    Appearance: She is not ill-appearing.  HENT:     Head: Normocephalic.     Right Ear: Ear canal and external ear normal.     Left Ear: Ear canal and external ear normal.     Ears:     Comments: Slight serous middle ear fluid bilaterally    Nose: No mucosal edema, congestion or rhinorrhea.     Right Turbinates: Swollen (mild).     Left Turbinates: Swollen (mild).     Comments: Mildly tender to maxillary sinuses    Mouth/Throat:     Mouth: Mucous membranes are moist. No oral lesions.     Pharynx: Posterior oropharyngeal erythema (mild) present. No pharyngeal swelling.     Tonsils: No tonsillar exudate. 1+ on the right. 1+ on the left.     Comments: Tonsils not inflamed Eyes:     General: Lids are normal.        Right eye: No discharge.        Left eye: No discharge.     Pupils: Pupils are equal, round, and reactive to light.     Comments: Conjunctivae not inflamed  Cardiovascular:     Rate and Rhythm: Normal rate and regular rhythm.     Heart sounds: No murmur heard.  No friction rub. No gallop.  Pulmonary:     Effort: Pulmonary effort is normal.     Breath sounds: Normal breath sounds. No wheezing, rhonchi or rales.  Abdominal:     General: Bowel sounds are normal.     Palpations: Abdomen is soft.     Tenderness: There is abdominal tenderness (mild to lower abdomen).  Musculoskeletal:     Cervical back: Neck supple. No rigidity.  Lymphadenopathy:     Cervical: No cervical adenopathy.  Neurological:     Mental Status: She is alert.       Assessment/Plan:   General Counseling: enzley kitchens understanding of the findings of todays visit and agrees with plan of treatment. she has been encouraged to call the office with any questions or concerns that should arise related to todays visit.    Time spent:*** Minutes    Joen Arts PA-C [facility] 03/13/2024 11:57 AM

## 2024-03-14 NOTE — Patient Instructions (Addendum)
-   Clean fridge thoroughly to remove mold. - Increase use of nasal sprays to daily temporarily. - Continue Xyzal. - Consider saline nasal rinses for symptom relief. - Use antihistamine eye drops like Zaditor or Pataday (allergy eye drops) for ocular symptoms. - Use albuterol inhaler as needed for shortness of breath, wheezing, chest tightness or persistent cough. - Consider Sudafed for sinus pressure if symptoms worsen/persist. - Rest and hydrate.

## 2024-03-29 ENCOUNTER — Encounter: Payer: Self-pay | Admitting: Adult Health

## 2024-03-29 ENCOUNTER — Ambulatory Visit: Admitting: Adult Health

## 2024-03-29 VITALS — BP 108/62 | HR 103 | Temp 98.3°F

## 2024-03-29 DIAGNOSIS — R197 Diarrhea, unspecified: Secondary | ICD-10-CM | POA: Diagnosis not present

## 2024-03-29 NOTE — Progress Notes (Signed)
 [facility] [address] [place], [place] [address] Phone: [phone] Fax: [phone]   Office Visit Note  Patient Name: Jillian Wagner  Date of Birth:Apr 15, 2001  Med Rec number [id number]  Date of Service: 03/29/2024  Garlic and Potassium citrate  Chief Complaint  Patient presents with   Acute Visit     HPI Patient is here reporting she returned from [place], 2 days ago. While there she was having vomiting for multiple days.  Once the vomiting stopped, she started having diarrhea, she took Immodium which helped.  But she continues to have    Current Medication:  Outpatient Encounter Medications as of 03/29/2024  Medication Sig   albuterol  (VENTOLIN  HFA) 108 (90 Base) MCG/ACT inhaler Inhale 1-2 puffs into the lungs every 4 (four) hours as needed for shortness of breath or wheezing (cough).   AZSTARYS 39.2-7.8 MG CAPS Take 1 capsule by mouth every morning.   cyanocobalamin  (VITAMIN B12) 1000 MCG/ML injection INJECT 1 ML INTO THE MUSCLE EVERY 30 DAYS. USE NEEDLE AND SYRINGE TO INJECT 1000MCG/1ML INTO MUSCLE ONCE EVERY 4 WEEKS   levocetirizine (XYZAL) 5 MG tablet Take 5 mg by mouth every evening.   magnesium oxide (MAG-OX) 400 MG tablet Take 400 mg by mouth daily.   Norgestimate-Ethinyl Estradiol Triphasic (TRI-LO-MARZIA) 0.18/0.215/0.25 MG-25 MCG tab 1 tablet   promethazine (PHENERGAN) 12.5 MG tablet Take 12.5 mg by mouth 3 (three) times daily as needed.   Venlafaxine HCl 37.5 MG TB24 Take 37.5 mg by mouth in the morning and at bedtime.   ZOLMitriptan (ZOMIG) 2.5 MG tablet Take 2.5 mg by mouth as needed for migraine.   No facility-administered encounter medications on file as of 03/29/2024.      Medical History: Past Medical History:  Diagnosis Date   Anemia    Migraine headache    Positive ANA (antinuclear antibody)      Vital Signs: BP 108/62   Pulse (!) 103   Temp 98.3 F (36.8 C) (Tympanic)   SpO2 100%    Review of Systems  Constitutional:   Negative for chills, fatigue and fever.  HENT:  Positive for sinus pressure. Negative for sore throat.   Eyes:  Negative for pain and itching.  Respiratory:  Negative for cough.   Gastrointestinal:  Positive for diarrhea and nausea. Negative for vomiting.    Physical Exam Vitals reviewed.  Abdominal:     Tenderness: There is abdominal tenderness in the right lower quadrant and left lower quadrant. There is no right CVA tenderness, left CVA tenderness, guarding or rebound.    Assessment/Plan: 1. Diarrhea, unspecified type (Primary) Continue BRAT diet and advace as discussed. Stay hydrated, add pepto to regimen. Follow up via MyChart messenger if symptoms fail to improve or may return to clinic as needed for worsening symptoms.   Given improvement of symptoms. Pt will monitor symptoms and follow up for new/worse or lingering symptoms.      General Counseling: damali broadfoot understanding of the findings of todays visit and agrees with plan of treatment. I have discussed any further diagnostic evaluation that may be needed or ordered today. We also reviewed her medications today. she has been encouraged to call the office with any questions or concerns that should arise related to todays visit.   No orders of the defined types were placed in this encounter.   No orders of the defined types were placed in this encounter.   Time spent:15 Minutes Time spent includes review of chart, medications, test results, and  follow up plan with the patient.    Juliene DOROTHA Howells AGNP-C Nurse Practitioner

## 2024-05-07 ENCOUNTER — Inpatient Hospital Stay: Attending: Oncology

## 2024-05-07 DIAGNOSIS — D509 Iron deficiency anemia, unspecified: Secondary | ICD-10-CM | POA: Insufficient documentation

## 2024-05-07 DIAGNOSIS — D508 Other iron deficiency anemias: Secondary | ICD-10-CM

## 2024-05-07 LAB — CBC (CANCER CENTER ONLY)
HCT: 37.9 % (ref 36.0–46.0)
Hemoglobin: 12.4 g/dL (ref 12.0–15.0)
MCH: 29.9 pg (ref 26.0–34.0)
MCHC: 32.7 g/dL (ref 30.0–36.0)
MCV: 91.3 fL (ref 80.0–100.0)
Platelet Count: 302 K/uL (ref 150–400)
RBC: 4.15 MIL/uL (ref 3.87–5.11)
RDW: 12.5 % (ref 11.5–15.5)
WBC Count: 6.8 K/uL (ref 4.0–10.5)
nRBC: 0 % (ref 0.0–0.2)

## 2024-05-07 LAB — IRON AND TIBC
Iron: 72 ug/dL (ref 28–170)
Saturation Ratios: 18 % (ref 10.4–31.8)
TIBC: 392 ug/dL (ref 250–450)
UIBC: 320 ug/dL

## 2024-05-07 LAB — FERRITIN: Ferritin: 178 ng/mL (ref 11–307)

## 2024-07-30 ENCOUNTER — Other Ambulatory Visit

## 2024-07-30 ENCOUNTER — Ambulatory Visit: Admitting: Oncology
